# Patient Record
Sex: Female | Born: 1990 | Race: White | Hispanic: No | Marital: Single | Smoking: Never smoker
Health system: Southern US, Community
[De-identification: ages and names within clinical notes are randomized; demographics above are authoritative.]

## PROBLEM LIST (undated history)

## (undated) ENCOUNTER — Inpatient Hospital Stay: Payer: Self-pay

## (undated) DIAGNOSIS — H9192 Unspecified hearing loss, left ear: Secondary | ICD-10-CM

## (undated) DIAGNOSIS — E669 Obesity, unspecified: Secondary | ICD-10-CM

## (undated) HISTORY — DX: Obesity, unspecified: E66.9

## (undated) HISTORY — PX: NO PAST SURGERIES: SHX2092

---
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
## 2012-02-01 ENCOUNTER — Emergency Department: Payer: Self-pay | Admitting: *Deleted

## 2012-02-11 ENCOUNTER — Emergency Department: Payer: Self-pay | Admitting: Emergency Medicine

## 2012-04-11 ENCOUNTER — Emergency Department: Payer: Self-pay | Admitting: Emergency Medicine

## 2012-06-15 ENCOUNTER — Emergency Department: Payer: Self-pay | Admitting: Emergency Medicine

## 2012-12-26 ENCOUNTER — Emergency Department: Payer: Self-pay | Admitting: Emergency Medicine

## 2013-01-12 ENCOUNTER — Emergency Department: Payer: Self-pay | Admitting: Internal Medicine

## 2013-02-03 ENCOUNTER — Emergency Department: Payer: Self-pay | Admitting: Emergency Medicine

## 2013-02-03 LAB — CBC
HGB: 12.6 g/dL (ref 12.0–16.0)
MCH: 29.4 pg (ref 26.0–34.0)
Platelet: 260 10*3/uL (ref 150–440)
WBC: 12.8 10*3/uL — ABNORMAL HIGH (ref 3.6–11.0)

## 2013-02-03 LAB — COMPREHENSIVE METABOLIC PANEL
Alkaline Phosphatase: 100 U/L (ref 82–169)
Anion Gap: 5 — ABNORMAL LOW (ref 7–16)
BUN: 10 mg/dL (ref 7–18)
Chloride: 107 mmol/L (ref 98–107)
Co2: 27 mmol/L (ref 21–32)
Creatinine: 0.81 mg/dL (ref 0.60–1.30)
EGFR (African American): >60
Glucose: 112 mg/dL — ABNORMAL HIGH (ref 65–99)
SGPT (ALT): 16 U/L (ref 12–78)
Sodium: 139 mmol/L (ref 136–145)
Total Protein: 7.7 g/dL (ref 6.4–8.6)

## 2013-02-03 LAB — URINALYSIS, COMPLETE
Blood: NEGATIVE
Glucose,UR: NEGATIVE mg/dL (ref 0–75)
Ketone: NEGATIVE
Ph: 5 (ref 4.5–8.0)
Protein: NEGATIVE
RBC,UR: 3 /HPF (ref 0–5)
WBC UR: 4 /HPF (ref 0–5)

## 2013-02-03 LAB — PREGNANCY, URINE: Pregnancy Test, Urine: NEGATIVE m[IU]/mL

## 2013-02-04 ENCOUNTER — Emergency Department: Payer: Self-pay | Admitting: Emergency Medicine

## 2013-02-04 LAB — CBC
HCT: 39.8 % (ref 35.0–47.0)
HGB: 13.7 g/dL (ref 12.0–16.0)
MCH: 31.1 pg (ref 26.0–34.0)
MCHC: 34.5 g/dL (ref 32.0–36.0)
MCV: 90 fL (ref 80–100)
RBC: 4.42 10*6/uL (ref 3.80–5.20)
RDW: 12.5 % (ref 11.5–14.5)
WBC: 6.7 10*3/uL (ref 3.6–11.0)

## 2013-11-02 ENCOUNTER — Emergency Department: Payer: Self-pay | Admitting: Emergency Medicine

## 2014-01-13 ENCOUNTER — Emergency Department: Payer: Self-pay | Admitting: Emergency Medicine

## 2014-01-13 LAB — CBC
HCT: 42.8 % (ref 35.0–47.0)
HGB: 14.3 g/dL (ref 12.0–16.0)
MCH: 31 pg (ref 26.0–34.0)
MCHC: 33.4 g/dL (ref 32.0–36.0)
MCV: 93 fL (ref 80–100)
Platelet: 232 10*3/uL (ref 150–440)
RBC: 4.62 10*6/uL (ref 3.80–5.20)
RDW: 12.7 % (ref 11.5–14.5)
WBC: 8.7 10*3/uL (ref 3.6–11.0)

## 2014-01-13 LAB — COMPREHENSIVE METABOLIC PANEL
ALK PHOS: 90 U/L
Albumin: 3.9 g/dL (ref 3.4–5.0)
Anion Gap: 8 (ref 7–16)
BILIRUBIN TOTAL: 0.3 mg/dL (ref 0.2–1.0)
BUN: 13 mg/dL (ref 7–18)
CALCIUM: 9.2 mg/dL (ref 8.5–10.1)
CO2: 23 mmol/L (ref 21–32)
Chloride: 108 mmol/L — ABNORMAL HIGH (ref 98–107)
Creatinine: 0.89 mg/dL (ref 0.60–1.30)
EGFR (Non-African Amer.): >60
Glucose: 94 mg/dL (ref 65–99)
OSMOLALITY: 277 (ref 275–301)
Potassium: 3.8 mmol/L (ref 3.5–5.1)
SGOT(AST): 32 U/L (ref 15–37)
SGPT (ALT): 21 U/L (ref 12–78)
SODIUM: 139 mmol/L (ref 136–145)
Total Protein: 8 g/dL (ref 6.4–8.2)

## 2014-01-13 LAB — URINALYSIS, COMPLETE
BILIRUBIN, UR: NEGATIVE
Bacteria: NONE SEEN
Blood: NEGATIVE
Glucose,UR: NEGATIVE mg/dL (ref 0–75)
LEUKOCYTE ESTERASE: NEGATIVE
NITRITE: NEGATIVE
Ph: 6 (ref 4.5–8.0)
Protein: 25
RBC,UR: 7 /HPF (ref 0–5)
SPECIFIC GRAVITY: 1.02 (ref 1.003–1.030)
Squamous Epithelial: NONE SEEN
WBC UR: 108 /HPF (ref 0–5)

## 2014-01-13 LAB — LIPASE, BLOOD: LIPASE: 91 U/L (ref 73–393)

## 2014-01-14 LAB — URINE CULTURE

## 2014-08-28 ENCOUNTER — Emergency Department: Payer: Self-pay | Admitting: Emergency Medicine

## 2015-05-14 ENCOUNTER — Emergency Department
Admission: EM | Admit: 2015-05-14 | Discharge: 2015-05-14 | Disposition: A | Discharge status: Home / Self Care | Payer: Self-pay | Attending: Emergency Medicine | Admitting: Emergency Medicine

## 2015-05-14 ENCOUNTER — Encounter: Payer: Self-pay | Admitting: *Deleted

## 2015-05-14 ENCOUNTER — Emergency Department: Payer: Self-pay

## 2015-05-14 DIAGNOSIS — Y9389 Activity, other specified: Secondary | ICD-10-CM | POA: Insufficient documentation

## 2015-05-14 DIAGNOSIS — X58XXXA Exposure to other specified factors, initial encounter: Secondary | ICD-10-CM | POA: Insufficient documentation

## 2015-05-14 DIAGNOSIS — Y9289 Other specified places as the place of occurrence of the external cause: Secondary | ICD-10-CM | POA: Insufficient documentation

## 2015-05-14 DIAGNOSIS — Y998 Other external cause status: Secondary | ICD-10-CM | POA: Insufficient documentation

## 2015-05-14 DIAGNOSIS — R0789 Other chest pain: Secondary | ICD-10-CM

## 2015-05-14 DIAGNOSIS — Z3202 Encounter for pregnancy test, result negative: Secondary | ICD-10-CM | POA: Insufficient documentation

## 2015-05-14 DIAGNOSIS — S29011A Strain of muscle and tendon of front wall of thorax, initial encounter: Secondary | ICD-10-CM | POA: Insufficient documentation

## 2015-05-14 LAB — BASIC METABOLIC PANEL
Anion gap: 9 (ref 5–15)
BUN: 13 mg/dL (ref 6–20)
CALCIUM: 9.2 mg/dL (ref 8.9–10.3)
CO2: 23 mmol/L (ref 22–32)
CREATININE: 0.74 mg/dL (ref 0.44–1.00)
Chloride: 108 mmol/L (ref 101–111)
GFR calc Af Amer: >60 mL/min (ref >60)
GLUCOSE: 98 mg/dL (ref 65–99)
POTASSIUM: 3.5 mmol/L (ref 3.5–5.1)
SODIUM: 140 mmol/L (ref 135–145)

## 2015-05-14 LAB — CBC
HEMATOCRIT: 39.8 % (ref 35.0–47.0)
Hemoglobin: 13.7 g/dL (ref 12.0–16.0)
MCH: 30.9 pg (ref 26.0–34.0)
MCHC: 34.4 g/dL (ref 32.0–36.0)
MCV: 89.8 fL (ref 80.0–100.0)
PLATELETS: 245 10*3/uL (ref 150–440)
RBC: 4.43 MIL/uL (ref 3.80–5.20)
RDW: 12.2 % (ref 11.5–14.5)
WBC: 10.2 10*3/uL (ref 3.6–11.0)

## 2015-05-14 LAB — POCT PREGNANCY, URINE: PREG TEST UR: NEGATIVE

## 2015-05-14 LAB — TROPONIN I

## 2015-05-14 MED ORDER — ACETAMINOPHEN 500 MG PO TABS
1000.0000 mg | ORAL_TABLET | Freq: Once | ORAL | Status: AC
  Administered 2015-05-14: 1000 mg via ORAL
  Filled 2015-05-14: qty 2

## 2015-05-14 NOTE — ED Notes (Signed)
Pt dc home ambulatory pain unchanged instructed on follow up plan no rx given. PT NAD AT DC

## 2015-05-14 NOTE — ED Provider Notes (Signed)
[facility] [place] Emergency Department Provider Note  ____________________________________________  Time seen: 42  I have reviewed the triage vital signs and the nursing notes.   HISTORY  Chief Complaint Chest Pain     HPI Alexis Horne is a 24 y.o. female with left sided chest pain for the past 2 days. Pain is worse with movement of the left arm.  It is also notably worse when she first wakes up in the morning and she feels stiff and sore. She reports she has discomfort in bed as well.  The patient has recently moving. She reports having to move a heavy vacuum cleaner and holding up and away from her body with her left arm.  She denies any shortness of breath, nausea, or vomiting. She has not had similar symptoms before.   History reviewed. No pertinent past medical history.  There are no active problems to display for this patient.   History reviewed. No pertinent past surgical history.  No current outpatient prescriptions on file.  Allergies Ibuprofen  No family history on file.  Social History Social History  Substance Use Topics  . Smoking status: Never Smoker   . Smokeless tobacco: None  . Alcohol Use: No    Review of Systems  Constitutional: Negative for fever. ENT: Negative for sore throat. Cardiovascular: Positive for chest pain. Respiratory: Negative for cough.Negative for abdominal pain, vomiting and diarrhea Gastrointestinal: Negative for abdominal pain, vomiting and diarrhea. Genitourinary: Negative for dysuria. Musculoskeletal: Pain, left chest, with movement of left arm. See history of present illness Skin: Negative for rash. Neurological: Denies headache or focal weakness. 10-point ROS otherwise negative.  ____________________________________________   PHYSICAL EXAM:  VITAL SIGNS: ED Triage Vitals  Enc Vitals Group     BP 05/14/15 2027 128/82 mmHg     Pulse Rate 05/14/15 2027 94     Resp 05/14/15 2027 18   Temp 05/14/15 2027 98.1 F (36.7 C)     Temp Source 05/14/15 2027 Oral     SpO2 05/14/15 2027 100 %     Weight 05/14/15 2027 230 lb (104.327 kg)     Height 05/14/15 2027  (1.651 m)     Head Cir --      Peak Flow --      Pain Score 05/14/15 2040 6     Pain Loc --      Pain Edu? --      Excl. in GC? --     Constitutional:  Alert and oriented. Appears a little uncomfortable but in no distress. ENT   Head: Normocephalic and atraumatic.   Nose: No congestion/rhinnorhea.    Cardiovascular: Normal rate, regular rhythm, no murmur noted Chest wall: Notable exquisite tenderness in the left pectoralis from the shoulder to the sternum. No tenderness on the right. Respiratory:  Normal respiratory effort, no tachypnea.    Breath sounds are clear and equal bilaterally.  Gastrointestinal: Soft and nontender. No distention.  Back: No muscle spasm, no tenderness, no CVA tenderness. Musculoskeletal: No deformity noted. Nontender with normal range of motion in all extremities.  No noted edema. Neurologic:  Normal speech and language. No gross focal neurologic deficits are appreciated.  Skin:  Skin is warm, dry. No rash noted. Psychiatric: Mood and affect are normal. Speech and behavior are normal.  ____________________________________________    LABS (pertinent positives/negatives)  Labs Reviewed  BASIC METABOLIC PANEL  CBC  TROPONIN I  POC URINE PREG, ED  POCT PREGNANCY, URINE     ____________________________________________  EKG  ED ECG REPORT I, Rafia Shedden W, the attending physician, personally viewed and interpreted this ECG.   Date: 05/14/2015  EKG Time: 2026  Rate: 90  Rhythm: Normal sinus rhythm  Axis: Normal  Intervals: Normal  ST&T Change: Downward T in V3, otherwise normal ST/T   ____________________________________________    RADIOLOGY  FINDINGS: The heart size and mediastinal contours are within normal limits. Both lungs are clear. The  visualized skeletal structures are unremarkable.  IMPRESSION: No active cardiopulmonary disease  ____________________________________________  INITIAL IMPRESSION / ASSESSMENT AND PLAN / ED COURSE  Pertinent labs & imaging results that were available during my care of the patient were reviewed by me and considered in my medical decision making (see chart for details).  Pleasant 24 year old female with obvious musculoskeletal pain on examination. This is consistent with her history of recent moving, lifting numerous heavy items, including a heavy vacuum that she mentions specifically.  Blood tests and chest x-ray are all normal. Her EKG looks good. Of note, the blood tests were ordered after triage by protocol, however at her age and with his history I do not feel they were indicated.  The patient is allergic to ibuprofen, otherwise we would start her on a nonsteroidal anti-inflammatory. I've advised her to take Tylenol and use a heating pad if needed to loosen the anterior chest wall and gain more comfort.  ____________________________________________   FINAL CLINICAL IMPRESSION(S) / ED DIAGNOSES  Final diagnoses:  Chest wall pain  Pectoralis muscle strain, initial encounter      Darien Ramusavid W Emmersyn Kratzke, MD 05/14/15 2214

## 2015-05-14 NOTE — ED Notes (Signed)
Pt reports two days of central chest pain and cough.

## 2015-05-14 NOTE — Discharge Instructions (Signed)
You have a strain of your left pectoralis muscle. Ordinarily, ibuprofen would be used, but since you are allergic to this, you are Limited to Tylenol and other conservative measures. You may find a heating pad helps ease some of the soreness. Continue light activity. Light activity and gentle stretching may help you recover more quickly. If you have shortness of breath, worsening chest pain, or other urgent concerns, return to the emergency department.  Chest Wall Pain Chest wall pain is pain in or around the bones and muscles of your chest. Sometimes, an injury causes this pain. Sometimes, the cause may not be known. This pain may take several weeks or longer to get better. HOME CARE Pay attention to any changes in your symptoms. Take these actions to help with your pain:  Rest as told by your doctor.  Avoid activities that cause pain. Try not to use your chest, belly (abdominal), or side muscles to lift heavy things.  If directed, apply ice to the painful area:  Put ice in a plastic bag.  Place a towel between your skin and the bag.  Leave the ice on for 20 minutes, 2-3 times per day.  Take over-the-counter and prescription medicines only as told by your doctor.  Do not use tobacco products, including cigarettes, chewing tobacco, and e-cigarettes. If you need help quitting, ask your doctor.  Keep all follow-up visits as told by your doctor. This is important. GET HELP IF:  You have a fever.  Your chest pain gets worse.  You have new symptoms. GET HELP RIGHT AWAY IF:  You feel sick to your stomach (nauseous) or you throw up (vomit).  You feel sweaty or light-headed.  You have a cough with phlegm (sputum) or you cough up blood.  You are short of breath.   This information is not intended to replace advice given to you by your health care provider. Make sure you discuss any questions you have with your health care provider.   Document Released: 12/24/2007 Document Revised:  03/28/2015 Document Reviewed: 10/02/2014 Elsevier Interactive Patient Education [employer]2016 Else[employer] Inc.

## 2016-01-09 ENCOUNTER — Emergency Department: Payer: Self-pay

## 2016-01-09 ENCOUNTER — Other Ambulatory Visit: Payer: Self-pay

## 2016-01-09 ENCOUNTER — Emergency Department
Admission: EM | Admit: 2016-01-09 | Discharge: 2016-01-09 | Discharge status: Against Medical Advice | Payer: Self-pay | Attending: Emergency Medicine | Admitting: Emergency Medicine

## 2016-01-09 ENCOUNTER — Encounter: Payer: Self-pay | Admitting: Emergency Medicine

## 2016-01-09 DIAGNOSIS — R079 Chest pain, unspecified: Secondary | ICD-10-CM | POA: Insufficient documentation

## 2016-01-09 LAB — CBC
HEMATOCRIT: 39.7 % (ref 35.0–47.0)
HEMOGLOBIN: 13.7 g/dL (ref 12.0–16.0)
MCH: 31 pg (ref 26.0–34.0)
MCHC: 34.6 g/dL (ref 32.0–36.0)
MCV: 89.5 fL (ref 80.0–100.0)
Platelets: 189 10*3/uL (ref 150–440)
RBC: 4.43 MIL/uL (ref 3.80–5.20)
RDW: 12.4 % (ref 11.5–14.5)
WBC: 6.2 10*3/uL (ref 3.6–11.0)

## 2016-01-09 LAB — BASIC METABOLIC PANEL
ANION GAP: 7 (ref 5–15)
BUN: 11 mg/dL (ref 6–20)
CALCIUM: 9.1 mg/dL (ref 8.9–10.3)
CO2: 25 mmol/L (ref 22–32)
Chloride: 108 mmol/L (ref 101–111)
Creatinine, Ser: 0.72 mg/dL (ref 0.44–1.00)
GFR calc Af Amer: >60 mL/min (ref >60)
GFR calc non Af Amer: >60 mL/min (ref >60)
GLUCOSE: 98 mg/dL (ref 65–99)
POTASSIUM: 3.4 mmol/L — AB (ref 3.5–5.1)
Sodium: 140 mmol/L (ref 135–145)

## 2016-01-09 LAB — URINALYSIS COMPLETE WITH MICROSCOPIC (ARMC ONLY)
BILIRUBIN URINE: NEGATIVE
Glucose, UA: NEGATIVE mg/dL
Hgb urine dipstick: NEGATIVE
KETONES UR: NEGATIVE mg/dL
LEUKOCYTES UA: NEGATIVE
Nitrite: NEGATIVE
PH: 5 (ref 5.0–8.0)
Protein, ur: NEGATIVE mg/dL
SPECIFIC GRAVITY, URINE: 1.025 (ref 1.005–1.030)

## 2016-01-09 LAB — FIBRIN DERIVATIVES D-DIMER (ARMC ONLY): Fibrin derivatives D-dimer (ARMC): 292 (ref 0–499)

## 2016-01-09 LAB — TROPONIN I: Troponin I: <0.03 ng/mL (ref <0.031)

## 2016-01-09 NOTE — ED Notes (Signed)
Patient ambulatory to triage with steady gait, without difficulty or distress noted; pt reports left sided CP, nonradiating since am with some SHOB; denies hx of same

## 2016-01-09 NOTE — ED Notes (Signed)
Pt attempted to urinate and was unsuccessful at doing so. Pt was told to keep specimen cup for when is able to urinate.

## 2016-01-09 NOTE — ED Provider Notes (Addendum)
[facility] Emergency Department Provider Note  ____________________________________________  Time seen: 2: 30 a.m.  I have reviewed the triage vital signs and the nursing notes.   HISTORY  Chief Complaint Chest Pain      HPI Alexis Horne is a 25 y.o. female presents with a 6 out of 10 nonradiating left-sided upper chest pain times one day. Patient denies any aggravating or alleviating factors. Patient denies any cough or shortness of breath at this time. Patient denies any lower respiratory pain or swelling.     Past medical history None There are no active problems to display for this patient.   Past surgical history None No current outpatient prescriptions on file.  Allergies Ibuprofen  No family history on file.  Social History Social History  Substance Use Topics  . Smoking status: Never Smoker   . Smokeless tobacco: None  . Alcohol Use: No    Review of Systems  Constitutional: Negative for fever. Eyes: Negative for visual changes. ENT: Negative for sore throat. Cardiovascular: Positive for chest pain. Respiratory: Positive for shortness of breath. Gastrointestinal: Negative for abdominal pain, vomiting and diarrhea. Genitourinary: Negative for dysuria. Musculoskeletal: Negative for back pain. Skin: Negative for rash. Neurological: Negative for headaches, focal weakness or numbness.   10-point ROS otherwise negative.  ____________________________________________   PHYSICAL EXAM:  VITAL SIGNS: ED Triage Vitals  Enc Vitals Group     BP 01/09/16 0059 135/84 mmHg     Pulse Rate 01/09/16 0059 84     Resp 01/09/16 0059 18     Temp 01/09/16 0059 97.9 F (36.6 C)     Temp Source 01/09/16 0059 Oral     SpO2 01/09/16 0059 97 %     Weight 01/09/16 0059 210 lb (95.255 kg)     Height 01/09/16 0059 5\' 5"  (1.651 m)     Head Cir --      Peak Flow --      Pain Score 01/09/16 0056 7     Pain Loc --      Pain Edu? --      Excl. in GC? --      Constitutional: Alert and oriented. Well appearing and in no distress. Eyes: Conjunctivae are normal. PERRL. Normal extraocular movements. ENT   Head: Normocephalic and atraumatic.   Nose: No congestion/rhinnorhea.   Mouth/Throat: Mucous membranes are moist.   Neck: No stridor. Hematological/Lymphatic/Immunilogical: No cervical lymphadenopathy. Cardiovascular: Normal rate, regular rhythm. Normal and symmetric distal pulses are present in all extremities. No murmurs, rubs, or gallops. Respiratory: Normal respiratory effort without tachypnea nor retractions. Breath sounds are clear and equal bilaterally. No wheezes/rales/rhonchi. Gastrointestinal: Soft and nontender. No distention. There is no CVA tenderness. Genitourinary: deferred Musculoskeletal: Nontender with normal range of motion in all extremities. No joint effusions.  No lower extremity tenderness nor edema. Neurologic:  Normal speech and language. No gross focal neurologic deficits are appreciated. Speech is normal.  Skin:  Skin is warm, dry and intact. No rash noted. Psychiatric: Mood and affect are normal. Speech and behavior are normal. Patient exhibits appropriate insight and judgment.  ____________________________________________    LABS (pertinent positives/negatives)  Labs Reviewed  BASIC METABOLIC PANEL - Abnormal; Notable for the following:    Potassium 3.4 (*)    All other components within normal limits  URINALYSIS COMPLETEWITH MICROSCOPIC ([facility] ONLY) - Abnormal; Notable for the following:    Color, Urine YELLOW (*)    APPearance HAZY (*)    Bacteria, UA RARE (*)  Squamous Epithelial / LPF 0-5 (*)    All other components within normal limits  CBC  TROPONIN I  FIBRIN DERIVATIVES D-DIMER ([facility] ONLY)     ____________________________________________   EKG  ED ECG REPORT I, [place] N BROWN, the attending physician, personally viewed and interpreted this ECG.    Date: 01/09/2016  EKG Time: 1:05 AM  Rate: 84  Rhythm: Normal sinus rhythm  Axis: Normal  Intervals: Normal  ST&T Change: None   ____________________________________________    RADIOLOGY DG Chest 2 View (Final result) Result time: 01/09/16 01:40:49   Final result by Rad Results In Interface (01/09/16 01:40:49)   Narrative:   CLINICAL DATA: Acute onset of left-sided chest pain radiating to the left shoulder. Difficulty breathing. Initial encounter.  EXAM: CHEST 2 VIEW  COMPARISON: Chest radiograph performed 05/14/2015  FINDINGS: The lungs are well-aerated and clear. There is no evidence of focal opacification, pleural effusion or pneumothorax.  The heart is normal in size; the mediastinal contour is within normal limits. No acute osseous abnormalities are seen.  IMPRESSION: No acute cardiopulmonary process seen.   Electronically Signed By: Roanna Raider M.D. On: 01/09/2016 01:40         INITIAL IMPRESSION / ASSESSMENT AND PLAN / ED COURSE  Pertinent labs & imaging results that were available during my care of the patient were reviewed by me and considered in my medical decision making (see chart for details).  Patient left the emergency department AGAINST MEDICAL ADVICE before d-dimer resulted  No clear etiology for the patient's chest pain identified at this time. Cardiac enzymes negative d-dimer negative EKG revealed no evidence of ischemia or infarction. Patient referred to primary care provider for outpatient evaluation   ____________________________________________   FINAL CLINICAL IMPRESSION(S) / ED DIAGNOSES  Final diagnoses:  Chest pain, unspecified chest pain type      Darci Current, MD 01/09/16 [id number]  Darci Current, MD 01/09/16 [id number]

## 2016-01-09 NOTE — Discharge Instructions (Signed)
Nonspecific Chest Pain  °Chest pain can be caused by many different conditions. There is always a chance that your pain could be related to something serious, such as a heart attack or a blood clot in your lungs. Chest pain can also be caused by conditions that are not life-threatening. If you have chest pain, it is very important to follow up with your health care provider. °CAUSES  °Chest pain can be caused by: °· Heartburn. °· Pneumonia or bronchitis. °· Anxiety or stress. °· Inflammation around your heart (pericarditis) or lung (pleuritis or pleurisy). °· A blood clot in your lung. °· A collapsed lung (pneumothorax). It can develop suddenly on its own (spontaneous pneumothorax) or from trauma to the chest. °· Shingles infection (varicella-zoster virus). °· Heart attack. °· Damage to the bones, muscles, and cartilage that make up your chest wall. This can include: °¨ Bruised bones due to injury. °¨ Strained muscles or cartilage due to frequent or repeated coughing or overwork. °¨ Fracture to one or more ribs. °¨ Sore cartilage due to inflammation (costochondritis). °RISK FACTORS  °Risk factors for chest pain may include: °· Activities that increase your risk for trauma or injury to your chest. °· Respiratory infections or conditions that cause frequent coughing. °· Medical conditions or overeating that can cause heartburn. °· Heart disease or family history of heart disease. °· Conditions or health behaviors that increase your risk of developing a blood clot. °· Having had chicken pox (varicella zoster). °SIGNS AND SYMPTOMS °Chest pain can feel like: °· Burning or tingling on the surface of your chest or deep in your chest. °· Crushing, pressure, aching, or squeezing pain. °· Dull or sharp pain that is worse when you move, cough, or take a deep breath. °· Pain that is also felt in your back, neck, shoulder, or arm, or pain that spreads to any of these areas. °Your chest pain may come and go, or it may stay  constant. °DIAGNOSIS °Lab tests or other studies may be needed to find the cause of your pain. Your health care provider may have you take a test called an ambulatory ECG (electrocardiogram). An ECG records your heartbeat patterns at the time the test is performed. You may also have other tests, such as: °· Transthoracic echocardiogram (TTE). During echocardiography, sound waves are used to create a picture of all of the heart structures and to look at how blood flows through your heart. °· Transesophageal echocardiogram (TEE). This is a more advanced imaging test that obtains images from inside your body. It allows your health care provider to see your heart in finer detail. °· Cardiac monitoring. This allows your health care provider to monitor your heart rate and rhythm in real time. °· Holter monitor. This is a portable device that records your heartbeat and can help to diagnose abnormal heartbeats. It allows your health care provider to track your heart activity for several days, if needed. °· Stress tests. These can be done through exercise or by taking medicine that makes your heart beat more quickly. °· Blood tests. °· Imaging tests. °TREATMENT  °Your treatment depends on what is causing your chest pain. Treatment may include: °· Medicines. These may include: °¨ Acid blockers for heartburn. °¨ Anti-inflammatory medicine. °¨ Pain medicine for inflammatory conditions. °¨ Antibiotic medicine, if an infection is present. °¨ Medicines to dissolve blood clots. °¨ Medicines to treat coronary artery disease. °· Supportive care for conditions that do not require medicines. This may include: °¨ Resting. °¨ Applying heat   or cold packs to injured areas. °¨ Limiting activities until pain decreases. °HOME CARE INSTRUCTIONS °· If you were prescribed an antibiotic medicine, finish it all even if you start to feel better. °· Avoid any activities that bring on chest pain. °· Do not use any tobacco products, including  cigarettes, chewing tobacco, or electronic cigarettes. If you need help quitting, ask your health care provider. °· Do not drink alcohol. °· Take medicines only as directed by your health care provider. °· Keep all follow-up visits as directed by your health care provider. This is important. This includes any further testing if your chest pain does not go away. °· If heartburn is the cause for your chest pain, you may be told to keep your head raised (elevated) while sleeping. This reduces the chance that acid will go from your stomach into your esophagus. °· Make lifestyle changes as directed by your health care provider. These may include: °¨ Getting regular exercise. Ask your health care provider to suggest some activities that are safe for you. °¨ Eating a heart-healthy diet. A registered dietitian can help you to learn healthy eating options. °¨ Maintaining a healthy weight. °¨ Managing diabetes, if necessary. °¨ Reducing stress. °SEEK MEDICAL CARE IF: °· Your chest pain does not go away after treatment. °· You have a rash with blisters on your chest. °· You have a fever. °SEEK IMMEDIATE MEDICAL CARE IF:  °· Your chest pain is worse. °· You have an increasing cough, or you cough up blood. °· You have severe abdominal pain. °· You have severe weakness. °· You faint. °· You have chills. °· You have sudden, unexplained chest discomfort. °· You have sudden, unexplained discomfort in your arms, back, neck, or jaw. °· You have shortness of breath at any time. °· You suddenly start to sweat, or your skin gets clammy. °· You feel nauseous or you vomit. °· You suddenly feel light-headed or dizzy. °· Your heart begins to beat quickly, or it feels like it is skipping beats. °These symptoms may represent a serious problem that is an emergency. Do not wait to see if the symptoms will go away. Get medical help right away. Call your local emergency services (911 in the [place] Do not drive yourself to the hospital. °  °This  information is not intended to replace advice given to you by your health care provider. Make sure you discuss any questions you have with your health care provider. °  °Document Released: 04/16/2005 Document Revised: 07/28/2014 Document Reviewed: 02/10/2014 °Elsevier Interactive Patient Education ©2016 Elsevier Inc. ° °

## 2016-01-15 ENCOUNTER — Encounter: Payer: Self-pay | Admitting: Emergency Medicine

## 2016-01-15 ENCOUNTER — Emergency Department
Admission: EM | Admit: 2016-01-15 | Discharge: 2016-01-15 | Disposition: A | Discharge status: Home / Self Care | Payer: Self-pay | Attending: Emergency Medicine | Admitting: Emergency Medicine

## 2016-01-15 DIAGNOSIS — Z0000 Encounter for general adult medical examination without abnormal findings: Secondary | ICD-10-CM | POA: Insufficient documentation

## 2016-01-15 NOTE — ED Notes (Signed)
States she was seen couple of days ago with cough  States she needs a note to go back to work..Marland Kitchen

## 2016-01-15 NOTE — Discharge Instructions (Signed)
°  Provisional Diagnosis You have been seen today, and your health care provider suspects that you have the following condition:__Release for work._ Before a final diagnosis is made, your test results may need to be considered. There is also a chance that your tests will need to be repeated. A check box has been marked next to each of the following items that applies to you: _____X-rays were taken today and will be read by a radiologist. The official report will be available to your health care provider in 1-2 days. If there is a difference between the initial results of your X-ray and the radiologist's report, we will call you. _____Lab work was done today and should be processed within 24 hours. Contact your health care provider in 1 day for your results. _____Cultures were done today. It generally takes at least 2 days for results to be available. Contact your health care provider in 2-3 days for your results. _____You had one or more procedures done today, and the results will not be available for __________ days. Contact your health care provider in __________ days for your results. If the hospital or lab is going to call you, make sure to give them a phone number where you can be called and a time when you will be available at that number. If you are going to call the hospital or lab, find out what number to call and the recommended time for you to call.   This information is not intended to replace advice given to you by your health care provider. Make sure you discuss any questions you have with your health care provider.   Document Released: 07/07/2005 Document Revised: 07/28/2014 Document Reviewed: 02/03/2014 Elsevier Interactive Patient Education [employer]

## 2016-01-15 NOTE — ED Provider Notes (Signed)
[facility] Emergency Department Provider Note   ____________________________________________  Time seen: Approximately 4:49 PM  I have reviewed the triage vital signs and the nursing notes.   HISTORY  Chief Complaint No chief complaint on file.    HPI Alexis Horne is a 25 y.o. female who arrives to the emergency department with complaint of medical clearance. Patient states that she felt she had the flu and told her employer this. When she arrived to work today her employer demanded that she have a note stating the she did not have the flu and could return to work. She denies fever, chills, myalgias, cough, shortness of breath, and URI symptoms.   History reviewed. No pertinent past medical history.  There are no active problems to display for this patient.   History reviewed. No pertinent past surgical history.  No current outpatient prescriptions on file.  Allergies Ibuprofen  No family history on file.  Social History Social History  Substance Use Topics  . Smoking status: Never Smoker   . Smokeless tobacco: None  . Alcohol Use: No    Review of Systems Constitutional: No fever/chills, night sweats, fatigue Eyes: No visual changes, ocular discharge, erythema ENT: No sore throat, odynophagia, rhinorrhea, coryza, or dysphagia Cardiovascular: Denies chest pain or palpitations Respiratory: Denies shortness of breath, dyspnea, and cough Gastrointestinal: No abdominal pain.  No nausea, no vomiting.  No diarrhea.   Musculoskeletal: Negative for myalgias Skin: Negative for rash. Neurological: Negative for headaches, focal weakness or numbness.  10-point ROS otherwise negative.  ____________________________________________   PHYSICAL EXAM:  VITAL SIGNS: ED Triage Vitals  Enc Vitals Group     BP 01/15/16 1633 129/89 mmHg     Pulse Rate 01/15/16 1633 91     Resp 01/15/16 1633 20     Temp 01/15/16 1633 98.1 F (36.7 C)   Temp Source 01/15/16 1633 Oral     SpO2 01/15/16 1633 97 %     Weight 01/15/16 1633 210 lb (95.255 kg)     Height 01/15/16 1633 5\' 5"  (1.651 m)     Head Cir --      Peak Flow --      Pain Score --      Pain Loc --      Pain Edu? --      Excl. in GC? --     Constitutional: Alert and oriented. Well appearing and in no acute distress. Eyes: Conjunctivae are normal. Without edema, erythema or discharge Head: Atraumatic, normocephalic Nose: No congestion/rhinnorhea. Mouth/Throat: Mucous membranes are moist.  Oropharynx non-erythematous, without edema or exudate Neck: No stridor.   Hematological/Lymphatic/Immunilogical: No cervical or supraclavicular lymphadenopathy. Cardiovascular: Normal rate, regular rhythm. Grossly normal heart sounds.  Good peripheral circulation. Respiratory: Normal respiratory effort.  No retractions. Lungs CTAB. No wheezing, rales or rhonchi Gastrointestinal: Soft and nontender. No distention.  Neurologic:  Normal speech and language. No gross focal neurologic deficits are appreciated. No gait instability. Skin:  Skin is warm, dry and intact. No rash noted. Psychiatric: Mood and affect are normal. Speech and behavior are normal.  ____________________________________________   LABS (all labs ordered are listed, but only abnormal results are displayed)  Labs Reviewed - No data to display ____________________________________________  EKG  None ____________________________________________  RADIOLOGY  None ____________________________________________   PROCEDURES  Procedure(s) performed: None  Critical Care performed: No  ____________________________________________   INITIAL IMPRESSION / ASSESSMENT AND PLAN / ED COURSE  Pertinent labs & imaging results that were available during my  care of the patient were reviewed by me and considered in my medical decision making (see chart for details).  Well adult exam: Based on clinical findings and  symptomology patient appears to be well without influenza. Patient discharged with note to return to work. On 6/21 patient left AMA before D dimer results arrived. Results were WNL.    ____________________________________________   FINAL CLINICAL IMPRESSION(S) / ED DIAGNOSES  Final diagnoses:  Well adult exam      NEW MEDICATIONS STARTED DURING THIS VISIT:  There are no discharge medications for this patient.    Note:  This document was prepared using Dragon voice recognition software and may include unintentional dictation errors.   Evangeline Dakinharles M Beers, PA-C 02/01/16 1204  Emily FilbertJonathan E Williams, MD 02/01/16 [phone]

## 2016-01-23 NOTE — ED Provider Notes (Signed)
Patient was seen by the physician assistant provider and was given a note to return to work. I was available for consultation.  Jennye MoccasinBrian S Madden Garron, MD 01/23/16 [phone]

## 2016-03-23 ENCOUNTER — Emergency Department: Payer: Self-pay

## 2016-03-23 ENCOUNTER — Emergency Department
Admission: EM | Admit: 2016-03-23 | Discharge: 2016-03-23 | Disposition: A | Discharge status: Home / Self Care | Payer: Self-pay | Attending: Emergency Medicine | Admitting: Emergency Medicine

## 2016-03-23 DIAGNOSIS — N201 Calculus of ureter: Secondary | ICD-10-CM | POA: Insufficient documentation

## 2016-03-23 LAB — URINALYSIS COMPLETE WITH MICROSCOPIC (ARMC ONLY)
Bacteria, UA: NONE SEEN
Bilirubin Urine: NEGATIVE
Glucose, UA: NEGATIVE mg/dL
LEUKOCYTES UA: NEGATIVE
Nitrite: NEGATIVE
PH: 5 (ref 5.0–8.0)
PROTEIN: 30 mg/dL — AB
Specific Gravity, Urine: 1.021 (ref 1.005–1.030)

## 2016-03-23 LAB — COMPREHENSIVE METABOLIC PANEL
ALT: 13 U/L — ABNORMAL LOW (ref 14–54)
ANION GAP: 7 (ref 5–15)
AST: 26 U/L (ref 15–41)
Albumin: 4.4 g/dL (ref 3.5–5.0)
Alkaline Phosphatase: 66 U/L (ref 38–126)
BUN: 11 mg/dL (ref 6–20)
CALCIUM: 9.6 mg/dL (ref 8.9–10.3)
CHLORIDE: 110 mmol/L (ref 101–111)
CO2: 24 mmol/L (ref 22–32)
Creatinine, Ser: 0.8 mg/dL (ref 0.44–1.00)
GFR calc non Af Amer: >60 mL/min (ref >60)
Glucose, Bld: 104 mg/dL — ABNORMAL HIGH (ref 65–99)
Potassium: 3.7 mmol/L (ref 3.5–5.1)
SODIUM: 141 mmol/L (ref 135–145)
Total Bilirubin: 0.4 mg/dL (ref 0.3–1.2)
Total Protein: 8.3 g/dL — ABNORMAL HIGH (ref 6.5–8.1)

## 2016-03-23 LAB — CBC
HCT: 42.5 % (ref 35.0–47.0)
HEMOGLOBIN: 14.7 g/dL (ref 12.0–16.0)
MCH: 30.8 pg (ref 26.0–34.0)
MCHC: 34.6 g/dL (ref 32.0–36.0)
MCV: 89 fL (ref 80.0–100.0)
Platelets: 290 10*3/uL (ref 150–440)
RBC: 4.77 MIL/uL (ref 3.80–5.20)
RDW: 12.4 % (ref 11.5–14.5)
WBC: 15.2 10*3/uL — ABNORMAL HIGH (ref 3.6–11.0)

## 2016-03-23 LAB — LIPASE, BLOOD: LIPASE: 20 U/L (ref 11–51)

## 2016-03-23 LAB — POCT PREGNANCY, URINE: PREG TEST UR: NEGATIVE

## 2016-03-23 MED ORDER — PROMETHAZINE HCL 25 MG/ML IJ SOLN
INTRAMUSCULAR | Status: AC
  Administered 2016-03-23: 25 mg via INTRAVENOUS
  Filled 2016-03-23: qty 1

## 2016-03-23 MED ORDER — OXYCODONE-ACETAMINOPHEN 5-325 MG PO TABS: 1.0000 | ORAL_TABLET | ORAL | 0 refills | Status: DC | PRN

## 2016-03-23 MED ORDER — FENTANYL CITRATE (PF) 100 MCG/2ML IJ SOLN
100.0000 ug | Freq: Once | INTRAMUSCULAR | Status: AC
  Administered 2016-03-23: 100 ug via INTRAVENOUS

## 2016-03-23 MED ORDER — MORPHINE SULFATE (PF) 4 MG/ML IV SOLN
4.0000 mg | Freq: Once | INTRAVENOUS | Status: AC
Start: 2016-03-23 — End: 2016-03-23
  Administered 2016-03-23: 4 mg via INTRAVENOUS

## 2016-03-23 MED ORDER — DOCUSATE SODIUM 100 MG PO CAPS: ORAL_CAPSULE | ORAL | 0 refills | Status: DC

## 2016-03-23 MED ORDER — HYDROMORPHONE HCL 1 MG/ML IJ SOLN: 1.0000 mg | INTRAMUSCULAR | Status: DC

## 2016-03-23 MED ORDER — PROMETHAZINE HCL 25 MG/ML IJ SOLN
25.0000 mg | Freq: Once | INTRAMUSCULAR | Status: AC
  Administered 2016-03-23: 25 mg via INTRAVENOUS

## 2016-03-23 MED ORDER — ONDANSETRON HCL 4 MG/2ML IJ SOLN
4.0000 mg | INTRAMUSCULAR | Status: AC
  Administered 2016-03-23: 4 mg via INTRAVENOUS
  Filled 2016-03-23: qty 2

## 2016-03-23 MED ORDER — MORPHINE SULFATE (PF) 4 MG/ML IV SOLN
INTRAVENOUS | Status: AC
  Administered 2016-03-23: 4 mg via INTRAVENOUS
  Filled 2016-03-23: qty 1

## 2016-03-23 MED ORDER — ONDANSETRON 4 MG PO TBDP: ORAL_TABLET | ORAL | 0 refills | Status: DC

## 2016-03-23 MED ORDER — TAMSULOSIN HCL 0.4 MG PO CAPS: ORAL_CAPSULE | ORAL | 0 refills | Status: DC

## 2016-03-23 MED ORDER — FENTANYL CITRATE (PF) 100 MCG/2ML IJ SOLN
INTRAMUSCULAR | Status: AC
  Administered 2016-03-23: 100 ug via INTRAVENOUS
  Filled 2016-03-23: qty 2

## 2016-03-23 NOTE — ED Triage Notes (Signed)
Pt arrived via EMS from home with c/o left flank pain that started this morning. Pt frequently shifting in chair unable to sit still demanding medications now. Refused to have treatment from EMS.  EMS was able to get a BP of 150/90 p 78 and oxygen 100% on RA.

## 2016-03-23 NOTE — Discharge Instructions (Signed)
You have been seen in the Emergency Department (ED) today for pain that is caused by kidney stones (or more specifically, a 1-mm stone in your left ureter, almost in the bladder).  As we have discussed, please drink plenty of fluids.  Please make a follow up appointment with the physician(s) listed elsewhere in this documentation.  You may take pain medication as needed but ONLY as prescribed.  Please also take your prescribed Flomax daily.  We also recommend that you take over-the-counter ibuprofen regularly according to label instructions over the next 5 days.  Take it with meals to minimize stomach discomfort.  Please see your doctor as soon as possible as stones may take 1-3 weeks to pass and you may require additional care or medications.  Do not drink alcohol, drive or participate in any other potentially dangerous activities while taking opiate pain medication as it may make you sleepy. Do not take this medication with any other sedating medications, either prescription or over-the-counter. If you were prescribed Percocet or Vicodin, do not take these with acetaminophen (Tylenol) as it is already contained within these medications.   Take Percocet as needed for severe pain.  This medication is an opiate (or narcotic) pain medication and can be habit forming.  Use it as little as possible to achieve adequate pain control.  Do not use or use it with extreme caution if you have a history of opiate abuse or dependence.  If you are on a pain contract with your primary care doctor or a pain specialist, be sure to let them know you were prescribed this medication today from the [facility] Emergency Department.  This medication is intended for your use only - do not give any to anyone else and keep it in a secure place where nobody else, especially children, have access to it.  It will also cause or worsen constipation, so you may want to consider taking an over-the-counter stool softener while you are  taking this medication.  Return to the Emergency Department (ED) or call your doctor if you have any worsening pain, fever, painful urination, are unable to urinate, or develop other symptoms that concern you.

## 2016-03-23 NOTE — ED Provider Notes (Signed)
[facility] Emergency Department Provider Note  ____________________________________________   First MD Initiated Contact with Patient 03/23/16 1559     (approximate)  I have reviewed the triage vital signs and the nursing notes.   HISTORY  Chief Complaint Back Pain; Abdominal Pain; and Flank Pain    HPI Alexis Horne is a 25 y.o. female with no significant PMH who presents for evaluation of acute onset severe left flank and left sided abdominal pain.  Severe, sharp stabbing pain, worse with movement and nothing makes it better.  +emesis (multiple episodes).  No diarrhea.  No vaginal symptoms.  Denies dysuria.  Denies fever/chills.  Received fentanyl 100 mcg in triage.    No past medical history on file.  There are no active problems to display for this patient.   No past surgical history on file.  Prior to Admission medications   Medication Sig Start Date End Date Taking? Authorizing Provider  docusate sodium (COLACE) 100 MG capsule Take 1 tablet once or twice daily as needed for constipation while taking narcotic pain medicine 03/23/16   Loleta Rose, MD  ondansetron (ZOFRAN ODT) 4 MG disintegrating tablet Allow 1-2 tablets to dissolve in your mouth every 8 hours as needed for nausea/vomiting 03/23/16   Loleta Rose, MD  oxyCODONE-acetaminophen (ROXICET) 5-325 MG tablet Take 1-2 tablets by mouth every 4 (four) hours as needed for severe pain. 03/23/16   Loleta Rose, MD  tamsulosin (FLOMAX) 0.4 MG CAPS capsule Take 1 tablet by mouth daily until you pass the kidney stone or no longer have symptoms 03/23/16   Loleta Rose, MD    Allergies Ibuprofen  No family history on file.  Social History Social History  Substance Use Topics  . Smoking status: Never Smoker  . Smokeless tobacco: Not on file  . Alcohol use No    Review of Systems Constitutional: No fever/chills Eyes: No visual changes. ENT: No sore throat. Cardiovascular: Denies chest  pain. Respiratory: Denies shortness of breath. Gastrointestinal: + LLQ abdominal and flank pain.  +N/V.  No diarrhea.  No constipation. Genitourinary: Negative for dysuria. Musculoskeletal: Negative for back pain. Skin: Negative for rash. Neurological: Negative for headaches, focal weakness or numbness.  10-point ROS otherwise negative.  ____________________________________________   PHYSICAL EXAM:  VITAL SIGNS: ED Triage Vitals  Enc Vitals Group     BP 03/23/16 1520 (!) 138/120     Pulse Rate 03/23/16 1520 65     Resp 03/23/16 1520 (!) 22     Temp 03/23/16 1520 98.1 F (36.7 C)     Temp Source 03/23/16 1520 Oral     SpO2 03/23/16 1520 100 %     Weight 03/23/16 1521 240 lb (108.9 kg)     Height 03/23/16 1521 5\' 5"  (1.651 m)     Head Circumference --      Peak Flow --      Pain Score 03/23/16 1521 10     Pain Loc --      Pain Edu? --      Excl. in GC? --     Constitutional: Alert and oriented. Well appearing and in no acute distress.  Slurring words and responding slowly after Fentanyl. Eyes: Conjunctivae are normal. PERRL. EOMI. Head: Atraumatic. Nose: No congestion/rhinnorhea. Mouth/Throat: Mucous membranes are moist.  Oropharynx non-erythematous. Neck: No stridor.  No meningeal signs.   Cardiovascular: Normal rate, regular rhythm. Good peripheral circulation. Grossly normal heart sounds. Respiratory: Normal respiratory effort.  No retractions. Lungs CTAB. Gastrointestinal: Obese.  Soft, tender to palpation in LLQ.  +Left CVA tenderness.  No right sided abd tenderness nor CVA tenderness. Musculoskeletal: No lower extremity tenderness nor edema. No gross deformities of extremities. Neurologic:  Normal speech and language. No gross focal neurologic deficits are appreciated.  Skin:  Skin is warm, dry and intact. No rash noted.   ____________________________________________   LABS (all labs ordered are listed, but only abnormal results are displayed)  Labs Reviewed   COMPREHENSIVE METABOLIC PANEL - Abnormal; Notable for the following:       Result Value   Glucose, Bld 104 (*)    Total Protein 8.3 (*)    ALT 13 (*)    All other components within normal limits  CBC - Abnormal; Notable for the following:    WBC 15.2 (*)    All other components within normal limits  URINALYSIS COMPLETEWITH MICROSCOPIC ([facility] ONLY) - Abnormal; Notable for the following:    Color, Urine YELLOW (*)    APPearance HAZY (*)    Ketones, ur TRACE (*)    Hgb urine dipstick 3+ (*)    Protein, ur 30 (*)    Squamous Epithelial / LPF 0-5 (*)    All other components within normal limits  LIPASE, BLOOD  POC URINE PREG, ED  POCT PREGNANCY, URINE   ____________________________________________  EKG  None - EKG not ordered by ED physician ____________________________________________  RADIOLOGY   Ct Renal Stone Study  Result Date: 03/23/2016 CLINICAL DATA:  Severe left flank pain and hematuria beginning this morning. EXAM: CT ABDOMEN AND PELVIS WITHOUT CONTRAST TECHNIQUE: Multidetector CT imaging of the abdomen and pelvis was performed following the standard protocol without IV contrast. COMPARISON:  None. FINDINGS: Lower chest:  No acute findings. Hepatobiliary: No mass visualized on this un-enhanced exam. Mild to moderate diffuse hepatic steatosis. Gallbladder is unremarkable. Pancreas: No mass or inflammatory process identified on this un-enhanced exam. Spleen: Within normal limits in size. Adrenals/Urinary Tract: Normal adrenal glands. Mild left hydroureteronephrosis is seen due to a 1 mm calculus in the distal left ureter near the ureterovesical junction on image 84/2. Stomach/Bowel: No evidence of obstruction, inflammatory process, or abnormal fluid collections. Normal appendix visualized. Vascular/Lymphatic: No pathologically enlarged lymph nodes. No evidence of abdominal aortic aneurysm. Reproductive: No mass or other significant abnormality. Other: None. Musculoskeletal:  No  suspicious bone lesions identified. IMPRESSION: Mild left hydroureteronephrosis due to 1 mm distal left ureteral calculus near the ureterovesical junction. Hepatic steatosis. Electronically Signed   By: Myles RosenthalJohn  Stahl M.D.   On: 03/23/2016 17:37    ____________________________________________   PROCEDURES  Procedure(s) performed:   Procedures   Critical Care performed: No ____________________________________________   INITIAL IMPRESSION / ASSESSMENT AND PLAN / ED COURSE  Pertinent labs & imaging results that were available during my care of the patient were reviewed by me and considered in my medical decision making (see chart for details).  Holding additional pain medication since she is slurring words after Fentayl, do not want to cause respiratory suppression.  Suspect UTI and/or kidney stone.  Less likely adnexal or GI cause.  Urine pending.   Clinical Course  Value Comment By Time  CT Renal Stone Study Small 1 mm stone.  No evidence of acute UTI.  I will update the patient and we will work on pain control. Loleta Roseory Unika Nazareno, MD 09/03 1741   The patient's pain is well-controlled at this time and she is sleeping comfortably.  I updated her boyfriend about everything including outpatient follow-up plan.  No  indication for antibiotics at this time.  I gave my usual customary return precautions. Loleta Rose, MD 09/03 1837    ____________________________________________  FINAL CLINICAL IMPRESSION(S) / ED DIAGNOSES  Final diagnoses:  Ureterolithiasis     MEDICATIONS GIVEN DURING THIS VISIT:  Medications  fentaNYL (SUBLIMAZE) injection 100 mcg (100 mcg Intravenous Given 03/23/16 1531)  ondansetron (ZOFRAN) injection 4 mg (4 mg Intravenous Given 03/23/16 1613)  promethazine (PHENERGAN) injection 25 mg (25 mg Intravenous Given 03/23/16 1709)  morphine 4 MG/ML injection 4 mg (4 mg Intravenous Given 03/23/16 1709)     NEW OUTPATIENT MEDICATIONS STARTED DURING THIS VISIT:  New  Prescriptions   DOCUSATE SODIUM (COLACE) 100 MG CAPSULE    Take 1 tablet once or twice daily as needed for constipation while taking narcotic pain medicine   ONDANSETRON (ZOFRAN ODT) 4 MG DISINTEGRATING TABLET    Allow 1-2 tablets to dissolve in your mouth every 8 hours as needed for nausea/vomiting   OXYCODONE-ACETAMINOPHEN (ROXICET) 5-325 MG TABLET    Take 1-2 tablets by mouth every 4 (four) hours as needed for severe pain.   TAMSULOSIN (FLOMAX) 0.4 MG CAPS CAPSULE    Take 1 tablet by mouth daily until you pass the kidney stone or no longer have symptoms    Modified Medications   No medications on file    Discontinued Medications   No medications on file     Note:  This document was prepared using Dragon voice recognition software and may include unintentional dictation errors.    Loleta Rose, MD 03/23/16 1840

## 2017-02-16 ENCOUNTER — Emergency Department: Payer: Self-pay

## 2017-02-16 ENCOUNTER — Telehealth: Payer: Self-pay

## 2017-02-16 ENCOUNTER — Other Ambulatory Visit: Payer: Self-pay

## 2017-02-16 ENCOUNTER — Encounter: Payer: Self-pay | Admitting: Emergency Medicine

## 2017-02-16 ENCOUNTER — Emergency Department
Admission: EM | Admit: 2017-02-16 | Discharge: 2017-02-16 | Disposition: A | Discharge status: Home / Self Care | Payer: Self-pay | Attending: Emergency Medicine | Admitting: Emergency Medicine

## 2017-02-16 DIAGNOSIS — Z79899 Other long term (current) drug therapy: Secondary | ICD-10-CM | POA: Insufficient documentation

## 2017-02-16 DIAGNOSIS — R079 Chest pain, unspecified: Secondary | ICD-10-CM | POA: Insufficient documentation

## 2017-02-16 DIAGNOSIS — F419 Anxiety disorder, unspecified: Secondary | ICD-10-CM | POA: Insufficient documentation

## 2017-02-16 LAB — POCT PREGNANCY, URINE: PREG TEST UR: NEGATIVE

## 2017-02-16 LAB — CBC
HCT: 39.6 % (ref 35.0–47.0)
Hemoglobin: 13.7 g/dL (ref 12.0–16.0)
MCH: 30.8 pg (ref 26.0–34.0)
MCHC: 34.7 g/dL (ref 32.0–36.0)
MCV: 88.9 fL (ref 80.0–100.0)
PLATELETS: 246 10*3/uL (ref 150–440)
RBC: 4.45 MIL/uL (ref 3.80–5.20)
RDW: 13 % (ref 11.5–14.5)
WBC: 6.4 10*3/uL (ref 3.6–11.0)

## 2017-02-16 LAB — BASIC METABOLIC PANEL
Anion gap: 6 (ref 5–15)
BUN: 10 mg/dL (ref 6–20)
CALCIUM: 9.3 mg/dL (ref 8.9–10.3)
CHLORIDE: 107 mmol/L (ref 101–111)
CO2: 25 mmol/L (ref 22–32)
CREATININE: 0.83 mg/dL (ref 0.44–1.00)
GFR calc non Af Amer: >60 mL/min (ref >60)
Glucose, Bld: 103 mg/dL — ABNORMAL HIGH (ref 65–99)
Potassium: 3.7 mmol/L (ref 3.5–5.1)
SODIUM: 138 mmol/L (ref 135–145)

## 2017-02-16 LAB — TROPONIN I: Troponin I: <0.03 ng/mL (ref <0.03)

## 2017-02-16 MED ORDER — ASPIRIN 81 MG PO CHEW
324.0000 mg | CHEWABLE_TABLET | Freq: Once | ORAL | Status: AC
  Administered 2017-02-16: 324 mg via ORAL
  Filled 2017-02-16: qty 4

## 2017-02-16 NOTE — ED Provider Notes (Signed)
[facility] Emergency Department Provider Note  ____________________________________________  Time seen: Approximately 12:30 PM  I have reviewed the triage vital signs and the nursing notes.   HISTORY  Chief Complaint Chest Pain   HPI Alexis Horne is a 26 y.o. female with a history of anxiety who presents for evaluation of chest pain. Patient reports 1 week of constant pressure pain located in the left side of her chest associated with intermittent pinching sensation and intermittent mild shortness of breath. The pain is nonradiating. Patient reports intermittent tingling sensation of her hands and feet. She does have a history of anxiety and over the course of the last 2 weeks she has been under a lot of stress. 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 She does have family history both mother and father of ischemic heart disease. She is not a smoker. She denies any changes in her pain with exertion. She has no diaphoresis, no nausea or vomiting. No cough or congestion, no fever or chills, no abdominal pain. The pain is not pleuritic in nature, she denies personal or family history of blood clot, recent travel or immobilization, leg pain or swelling, hemoptysis, exogenous hormones.  History reviewed. No pertinent past medical history.  PMH Anxiety  No past surgical history on file.  Prior to Admission medications   Medication Sig Start Date End Date Taking? Authorizing Provider  docusate sodium (COLACE) 100 MG capsule Take 1 tablet once or twice daily as needed for constipation while taking narcotic pain medicine Patient not taking: Reported on 02/16/2017 03/23/16   Loleta Rose, MD  ondansetron (ZOFRAN ODT) 4 MG disintegrating tablet Allow 1-2 tablets to dissolve in your mouth every 8 hours as needed for nausea/vomiting Patient not taking: Reported on 02/16/2017  03/23/16   Loleta Rose, MD  oxyCODONE-acetaminophen (ROXICET) 5-325 MG tablet Take 1-2 tablets by mouth every 4 (four) hours as needed for severe pain. Patient not taking: Reported on 02/16/2017 03/23/16   Loleta Rose, MD  tamsulosin [facility]) 0.4 MG CAPS capsule Take 1 tablet by mouth daily until you pass the kidney stone or no longer have symptoms Patient not taking: Reported on 02/16/2017 03/23/16   Loleta Rose, MD    Allergies Ibuprofen  Family History Father - heart attack Mother - heart attack  Social History Social History  Substance Use Topics  . Smoking status: Never Smoker  . Smokeless tobacco: Not on file  . Alcohol use No    Review of Systems  Constitutional: Negative for fever. Eyes: Negative for visual changes. ENT: Negative for sore throat. Neck: No neck pain  Cardiovascular: + chest pain. Respiratory: + shortness of breath. Gastrointestinal: Negative for abdominal pain, vomiting or diarrhea. Genitourinary: Negative for dysuria. Musculoskeletal: Negative for back pain. Skin: Negative for rash. Neurological: Negative for headaches, weakness or numbness. Psych: No SI or HI. + anxiety  ____________________________________________   PHYSICAL EXAM:  VITAL SIGNS: ED Triage Vitals  Enc Vitals Group     BP 02/16/17 0932 133/80     Pulse Rate 02/16/17 0932 70     Resp 02/16/17 0932 18     Temp 02/16/17 0932 98.8 F (37.1 C)     Temp Source 02/16/17 0932 Oral     SpO2 02/16/17 0932 98 %     Weight 02/16/17 0933 230 lb (104.3 kg)     Height 02/16/17 0933 5\' 3"  (  1.6 m)     Head Circumference --      Peak Flow --      Pain Score 02/16/17 0933 6     Pain Loc --      Pain Edu? --      Excl. in GC? --     Constitutional: Alert and oriented. Well appearing and in no apparent distress. HEENT:      Head: Normocephalic and atraumatic.         Eyes: Conjunctivae are normal. Sclera is non-icteric.       Mouth/Throat: Mucous membranes are moist.       Neck:  Supple with no signs of meningismus. Cardiovascular: Regular rate and rhythm. No murmurs, gallops, or rubs. 2+ symmetrical distal pulses are present in all extremities. No JVD. Respiratory: Normal respiratory effort. Lungs are clear to auscultation bilaterally. No wheezes, crackles, or rhonchi.  Gastrointestinal: Soft, non tender, and non distended with positive bowel sounds. No rebound or guarding. Musculoskeletal: Nontender with normal range of motion in all extremities. No edema, cyanosis, or erythema of extremities. Neurologic: Normal speech and language. Face is symmetric. Moving all extremities. No gross focal neurologic deficits are appreciated. Skin: Skin is warm, dry and intact. No rash noted. Psychiatric: Mood and affect are normal. Speech and behavior are normal.  ____________________________________________   LABS (all labs ordered are listed, but only abnormal results are displayed)  Labs Reviewed  BASIC METABOLIC PANEL - Abnormal; Notable for the following:       Result Value   Glucose, Bld 103 (*)    All other components within normal limits  CBC  TROPONIN I  TROPONIN I   ____________________________________________  EKG  ED ECG REPORT I, Nita Sicklearolina Quanesha Klimaszewski, the attending physician, personally viewed and interpreted this ECG.  Normal sinus rhythm, rate of 66, normal intervals, normal axis, T-wave inversion in leads 3, V2 and V3 which is unchanged from prior from June 2017. ____________________________________________  RADIOLOGY  CXR: No edema or consolidation. ____________________________________________   PROCEDURES  Procedure(s) performed: None Procedures Critical Care performed:  None ____________________________________________   INITIAL IMPRESSION / ASSESSMENT AND PLAN / ED COURSE  26 y.o. female with a history of anxiety who presents for evaluation of chest pain constant for one week associated with mild shortness of breath and tingling of her  hands and feet. Patient has anxiety and has been under a lot of stress recently. Heart score of 1 for family history. First EKG with no ischemic changes. I do believe patient's presentation is due to her anxiety. However due to her strong family history of heart disease will get a second cardiac enzyme and give her an aspirin. We'll refer her for outpatient management of her anxiety and referred to cardiology for further evaluation. PERC negative.  ----------------------------------------- 12:33 PM on 02/16/2017 -----------------------------------------   OBSERVATION CARE: This patient is being placed under observation care for the following reasons: Chest pain with repeat testing to rule out ischemia   ----------------------------------------- 1:00 PM on 02/16/2017 ----------------------------------------- Patient remains stable. Calm in the ED.   ----------------------------------------- 1:57 PM on 02/16/2017 -----------------------------------------   END OF OBSERVATION STATUS: After an appropriate period of observation, this patient is being discharged due to the following reason(s): Troponin 2 negative. Patient's workup essentially negative. Patient remains extremely well appearing. She is going to be referred to cardiology for further evaluation. Patient provided with outpatient resources for management of her anxiety. Recommend return to the emergency room if she develops new or worsening chest pain, dizziness,  fever, or any other symptoms that are concerning to her.       Pertinent labs & imaging results that were available during my care of the patient were reviewed by me and considered in my medical decision making (see chart for details).    ____________________________________________   FINAL CLINICAL IMPRESSION(S) / ED DIAGNOSES  Final diagnoses:  Chest pain, unspecified type  Anxiety      NEW MEDICATIONS STARTED DURING THIS VISIT:  New Prescriptions   No  medications on file     Note:  This document was prepared using Dragon voice recognition software and may include unintentional dictation errors.    Don PerkingVeronese, [place], MD 02/16/17 [phone]

## 2017-02-16 NOTE — Telephone Encounter (Signed)
No answer no voicemail Needed to schedule an ED fu appointment was seen on 02/16/17 for CP   Will try again at a later time

## 2017-02-16 NOTE — Discharge Instructions (Signed)

## 2017-02-16 NOTE — ED Triage Notes (Signed)
Pt reports intermittent left side non radiating chest pain that has been worse the past two days. Pt reports associated nausea, shortness of breath and tingling of hands. Pt reports history of anxiety but this feels different.

## 2017-02-23 NOTE — Telephone Encounter (Signed)
No answer no voicemail Needed to schedule an ED fu appointment was seen on 02/16/17 for CP   Will try again at a later time

## 2017-02-25 NOTE — Telephone Encounter (Signed)
No answer no voicemail °Needed to schedule an ED fu appointment was seen on 02/16/17 for CP  °  °Will try again at a later time °

## 2017-07-15 ENCOUNTER — Other Ambulatory Visit: Payer: Self-pay

## 2017-07-15 ENCOUNTER — Emergency Department
Admission: EM | Admit: 2017-07-15 | Discharge: 2017-07-15 | Disposition: A | Discharge status: Home / Self Care | Payer: Medicaid Other | Attending: Student in an Organized Health Care Education/Training Program | Admitting: Student in an Organized Health Care Education/Training Program

## 2017-07-15 ENCOUNTER — Emergency Department: Payer: Medicaid Other

## 2017-07-15 ENCOUNTER — Encounter: Payer: Self-pay | Admitting: Radiology

## 2017-07-15 DIAGNOSIS — Z3A01 Less than 8 weeks gestation of pregnancy: Secondary | ICD-10-CM | POA: Diagnosis not present

## 2017-07-15 DIAGNOSIS — R1032 Left lower quadrant pain: Secondary | ICD-10-CM | POA: Insufficient documentation

## 2017-07-15 DIAGNOSIS — O9989 Other specified diseases and conditions complicating pregnancy, childbirth and the puerperium: Secondary | ICD-10-CM | POA: Insufficient documentation

## 2017-07-15 DIAGNOSIS — R102 Pelvic and perineal pain: Secondary | ICD-10-CM

## 2017-07-15 LAB — COMPREHENSIVE METABOLIC PANEL
ALT: 9 U/L — ABNORMAL LOW (ref 14–54)
AST: 21 U/L (ref 15–41)
Albumin: 4.1 g/dL (ref 3.5–5.0)
Alkaline Phosphatase: 63 U/L (ref 38–126)
Anion gap: 6 (ref 5–15)
BILIRUBIN TOTAL: 0.7 mg/dL (ref 0.3–1.2)
BUN: 9 mg/dL (ref 6–20)
CHLORIDE: 106 mmol/L (ref 101–111)
CO2: 24 mmol/L (ref 22–32)
Calcium: 9.2 mg/dL (ref 8.9–10.3)
Creatinine, Ser: 0.69 mg/dL (ref 0.44–1.00)
Glucose, Bld: 108 mg/dL — ABNORMAL HIGH (ref 65–99)
POTASSIUM: 3.4 mmol/L — AB (ref 3.5–5.1)
Sodium: 136 mmol/L (ref 135–145)
TOTAL PROTEIN: 7.5 g/dL (ref 6.5–8.1)

## 2017-07-15 LAB — URINALYSIS, COMPLETE (UACMP) WITH MICROSCOPIC
Bacteria, UA: NONE SEEN
Bilirubin Urine: NEGATIVE
GLUCOSE, UA: NEGATIVE mg/dL
HGB URINE DIPSTICK: NEGATIVE
KETONES UR: NEGATIVE mg/dL
Leukocytes, UA: NEGATIVE
NITRITE: NEGATIVE
PH: 5 (ref 5.0–8.0)
Protein, ur: NEGATIVE mg/dL
Specific Gravity, Urine: 1.026 (ref 1.005–1.030)

## 2017-07-15 LAB — CBC
HEMATOCRIT: 40.3 % (ref 35.0–47.0)
Hemoglobin: 13.7 g/dL (ref 12.0–16.0)
MCH: 30.8 pg (ref 26.0–34.0)
MCHC: 34 g/dL (ref 32.0–36.0)
MCV: 90.6 fL (ref 80.0–100.0)
PLATELETS: 248 10*3/uL (ref 150–440)
RBC: 4.45 MIL/uL (ref 3.80–5.20)
RDW: 12.4 % (ref 11.5–14.5)
WBC: 11.4 10*3/uL — AB (ref 3.6–11.0)

## 2017-07-15 LAB — HCG, QUANTITATIVE, PREGNANCY: hCG, Beta Chain, Quant, S: 3473 m[IU]/mL — ABNORMAL HIGH (ref <5)

## 2017-07-15 NOTE — ED Triage Notes (Signed)
Pt in with co lower abd pain and low back pain that started today. Found out 12/24 that she is pregnant, unsure of gestation. Denies any vag bleeding or dysuria.

## 2017-07-15 NOTE — ED Provider Notes (Signed)
[facility] Emergency Department Provider Note    First MD Initiated Contact with Patient 07/15/17 2128     (approximate)  I have reviewed the triage vital signs and the nursing notes.   HISTORY  Chief Complaint Abdominal Pain    HPI Alexis Horne is a 26 y.o. female who was previously healthy with no recent admissions to hospital presents with chief complaint of left lower quadrant pelvic pain is a going on but became acutely worse today.  Has noted over the past day or 2.  States she may have had some dysuria but no vaginal discharge or vaginal bleeding.  States that she learned on Christmas that she is pregnant.  Last menstrual cycle was in late October but has had a regular menses.  Denies any family history of bleeding disorders.  No diarrhea.  No fevers.  No nausea or vomiting.  This is her first pregnancy.  PMH: previously healthy FMH:  No bleeding disorders PSH: no recent surgeries There are no active problems to display for this patient.     Prior to Admission medications   Medication Sig Start Date End Date Taking? Authorizing Provider  docusate sodium (COLACE) 100 MG capsule Take 1 tablet once or twice daily as needed for constipation while taking narcotic pain medicine Patient not taking: Reported on 02/16/2017 03/23/16   Loleta RoseForbach, Cory, MD  ondansetron (ZOFRAN ODT) 4 MG disintegrating tablet Allow 1-2 tablets to dissolve in your mouth every 8 hours as needed for nausea/vomiting Patient not taking: Reported on 02/16/2017 03/23/16   Loleta RoseForbach, Cory, MD  oxyCODONE-acetaminophen (ROXICET) 5-325 MG tablet Take 1-2 tablets by mouth every 4 (four) hours as needed for severe pain. Patient not taking: Reported on 02/16/2017 03/23/16   Loleta RoseForbach, Cory, MD  tamsulosin [facility](FLOMAX) 0.4 MG CAPS capsule Take 1 tablet by mouth daily until you pass the kidney stone or no longer have symptoms Patient not taking: Reported on 02/16/2017 03/23/16   Loleta RoseForbach, Cory, MD     Allergies Ibuprofen    Social History Social History   Tobacco Use  . Smoking status: Never Smoker  Substance Use Topics  . Alcohol use: No  . Drug use: No    Review of Systems Patient denies headaches, rhinorrhea, blurry vision, numbness, shortness of breath, chest pain, edema, cough, abdominal pain, nausea, vomiting, diarrhea, dysuria, fevers, rashes or hallucinations unless otherwise stated above in HPI. ____________________________________________   PHYSICAL EXAM:  VITAL SIGNS: Vitals:   07/15/17 2040 07/15/17 2130  BP: (!) 143/61 126/78  Pulse: 92 88  Resp: 20   Temp: 98.6 F (37 C)   SpO2: 100% 100%    Constitutional: Alert and oriented. Well appearing and in no acute distress. Eyes: Conjunctivae are normal.  Head: Atraumatic. Nose: No congestion/rhinnorhea. Mouth/Throat: Mucous membranes are moist.   Neck: No stridor. Painless ROM.  Cardiovascular: Normal rate, regular rhythm. Grossly normal heart sounds.  Good peripheral circulation. Respiratory: Normal respiratory effort.  No retractions. Lungs CTAB. Gastrointestinal: Soft with mild llq pain but no rebound guarding or hernia. No distention. No abdominal bruits. No CVA tenderness. Genitourinary:  Musculoskeletal: No lower extremity tenderness nor edema.  No joint effusions. Neurologic:  Normal speech and language. No gross focal neurologic deficits are appreciated. No facial droop Skin:  Skin is warm, dry and intact. No rash noted. Psychiatric: Mood and affect are normal. Speech and behavior are normal.  ____________________________________________   LABS (all labs ordered are listed, but only abnormal results are displayed)  Results  for orders placed or performed during the hospital encounter of 07/15/17 (from the past 24 hour(s))  CBC     Status: Abnormal   Collection Time: 07/15/17  8:41 PM  Result Value Ref Range   WBC 11.4 (H) 3.6 - 11.0 K/uL   RBC 4.45 3.80 - 5.20 MIL/uL   Hemoglobin 13.7  12.0 - 16.0 g/dL   HCT 09.840.3 11.935.0 - 14.747.0 %   MCV 90.6 80.0 - 100.0 fL   MCH 30.8 26.0 - 34.0 pg   MCHC 34.0 32.0 - 36.0 g/dL   RDW 82.912.4 56.211.5 - 13.014.5 %   Platelets 248 150 - 440 K/uL  Comprehensive metabolic panel     Status: Abnormal   Collection Time: 07/15/17  8:41 PM  Result Value Ref Range   Sodium 136 135 - 145 mmol/L   Potassium 3.4 (L) 3.5 - 5.1 mmol/L   Chloride 106 101 - 111 mmol/L   CO2 24 22 - 32 mmol/L   Glucose, Bld 108 (H) 65 - 99 mg/dL   BUN 9 6 - 20 mg/dL   Creatinine, Ser 8.650.69 0.44 - 1.00 mg/dL   Calcium 9.2 8.9 - 78.410.3 mg/dL   Total Protein 7.5 6.5 - 8.1 g/dL   Albumin 4.1 3.5 - 5.0 g/dL   AST 21 15 - 41 U/L   ALT 9 (L) 14 - 54 U/L   Alkaline Phosphatase 63 38 - 126 U/L   Total Bilirubin 0.7 0.3 - 1.2 mg/dL   GFR calc non Af Amer >60 >60 mL/min   GFR calc Af Amer >60 >60 mL/min   Anion gap 6 5 - 15  Urinalysis, Complete w Microscopic     Status: Abnormal   Collection Time: 07/15/17  8:41 PM  Result Value Ref Range   Color, Urine YELLOW (A) YELLOW   APPearance CLEAR (A) CLEAR   Specific Gravity, Urine 1.026 1.005 - 1.030   pH 5.0 5.0 - 8.0   Glucose, UA NEGATIVE NEGATIVE mg/dL   Hgb urine dipstick NEGATIVE NEGATIVE   Bilirubin Urine NEGATIVE NEGATIVE   Ketones, ur NEGATIVE NEGATIVE mg/dL   Protein, ur NEGATIVE NEGATIVE mg/dL   Nitrite NEGATIVE NEGATIVE   Leukocytes, UA NEGATIVE NEGATIVE   RBC / HPF 0-5 0 - 5 RBC/hpf   WBC, UA 0-5 0 - 5 WBC/hpf   Bacteria, UA NONE SEEN NONE SEEN   Squamous Epithelial / LPF 0-5 (A) NONE SEEN   Mucus PRESENT   hCG, quantitative, pregnancy     Status: Abnormal   Collection Time: 07/15/17  8:41 PM  Result Value Ref Range   hCG, Beta Chain, Quant, S 3,473 (H) <5 mIU/mL   ____________________________________________ ____________________________________________  RADIOLOGY  I personally reviewed all radiographic images ordered to evaluate for the above acute complaints and reviewed radiology reports and findings.  These  findings were personally discussed with the patient.  Please see medical record for radiology report.  ____________________________________________   PROCEDURES  Procedure(s) performed:  Procedures    Critical Care performed: no ____________________________________________   INITIAL IMPRESSION / ASSESSMENT AND PLAN / ED COURSE  Pertinent labs & imaging results that were available during my care of the patient were reviewed by me and considered in my medical decision making (see chart for details).  DDX: ectopic, roundligament pain, toa, torsion, cyst  Alexis Horne is a 26 y.o. who presents to the ED with pelvic pain in early pregnancy.  Pain is located in left lower quadrant.  Not consistent with acute appendicitis.  No evidence of UTI.  Blood work is reassuring.  Mild leukocytosis consistent with early pregnancy.  Patient denies any vaginal discharge or vaginal bleeding.  Based on location of pain will order ultrasound to confirm intrauterine pregnancy and exclude any evidence of ectopic torsion or cyst.  Clinical Course as of Jul 16 2327  Wed Jul 15, 2017  2324 Patient reassessed.  Ultrasound confirms intrauterine pregnancy with no evidence of torsion or evidence of acute process in the pelvis.  Patient remains pain-free on repeat abdominal exam.  Patient is stable and appropriate for follow-up with OB/GYN as outpatient.  [PR]    Clinical Course User Index [PR] Willy Eddy, MD     ____________________________________________   FINAL CLINICAL IMPRESSION(S) / ED DIAGNOSES  Final diagnoses:  LLQ pain  Pelvic pain in female  Less than [redacted] weeks gestation of pregnancy      NEW MEDICATIONS STARTED DURING THIS VISIT:  This SmartLink is deprecated. Use AVSMEDLIST instead to display the medication list for a patient.   Note:  This document was prepared using Dragon voice recognition software and may include unintentional dictation errors.    Willy Eddy, MD 07/15/17 2329

## 2017-07-15 NOTE — ED Notes (Signed)

## 2017-07-15 NOTE — Discharge Instructions (Signed)

## 2017-07-15 NOTE — ED Notes (Addendum)
Pt states she feels like she is having a cramping sensation in her lower abdomen on both sides that is wrapping around to her back. Denies any vaginal bleeding

## 2017-07-21 NOTE — L&D Delivery Note (Signed)
Obstetrical Delivery Note   Date of Delivery:   03/12/2018 Primary OB:   [facility] Gestational Age/EDD: [id number] (dated by 1st trimester ultrasound) Antepartum complications: obesity  Delivered By:   Farrel Connersolleen Raeghan Demeter, CNM  Delivery Type:   spontaneous vaginal delivery  Procedure Details:   Mother pushed to deliver a vigorous female infant in OA at 1219 PM. CAN x1 reduced over shoulder. Baby suctioned and dried and placed on mother's abdomen. After delayed cord clamping the cord was cut by the father of the baby and baby was placed skin to skin with mother. Spontaneous delivery of intact placenta and 3 vessel cord, followed by uterine atony. Uterine atony resolved with bimanual, IV Pitocin, IM Methergine and Cytotec 800 mcg PR. Repair of second degree perineal laceration with 3-0 Chromic and 2-0 Vicryl in layers under epidural and local anesthesia with 1% Lidocaine.. There was a tear at the right hymenal ring that was also repaired due to bleeding and was the probable cause of the bleeding prior to delivery. EBL: Anesthesia:    local and epidural Intrapartum complications: Uterine atony GBS:    negative Laceration:    Second degree perineal; laceration at right hymenal ring Episiotomy:    none Placenta:    Via active 3rd stage. To pathology: no Estimated Blood Loss:   Baby:    Liveborn female, Apgars 8/9, weight 7#6oz    Farrel Connersolleen Meir [place]wood, CNM

## 2017-08-19 ENCOUNTER — Ambulatory Visit (INDEPENDENT_AMBULATORY_CARE_PROVIDER_SITE_OTHER): Payer: Medicaid Other | Admitting: Maternal Newborn

## 2017-08-19 ENCOUNTER — Encounter: Payer: Self-pay | Admitting: Maternal Newborn

## 2017-08-19 VITALS — BP 120/80 | Wt 239.0 lb

## 2017-08-19 DIAGNOSIS — Z3400 Encounter for supervision of normal first pregnancy, unspecified trimester: Secondary | ICD-10-CM

## 2017-08-19 DIAGNOSIS — O0990 Supervision of high risk pregnancy, unspecified, unspecified trimester: Secondary | ICD-10-CM | POA: Insufficient documentation

## 2017-08-19 DIAGNOSIS — Z1379 Encounter for other screening for genetic and chromosomal anomalies: Secondary | ICD-10-CM

## 2017-08-19 DIAGNOSIS — Z6841 Body Mass Index (BMI) 40.0 and over, adult: Secondary | ICD-10-CM

## 2017-08-19 NOTE — Patient Instructions (Signed)
First Trimester of Pregnancy The first trimester of pregnancy is from week 1 until the end of week 13 (months 1 through 3). A week after a sperm fertilizes an egg, the egg will implant on the wall of the uterus. This embryo will begin to develop into a baby. Genes from you and your partner will form the baby. The female genes will determine whether the baby will be a boy or a girl. At 6-8 weeks, the eyes and face will be formed, and the heartbeat can be seen on ultrasound. At the end of 12 weeks, all the baby's organs will be formed. Now that you are pregnant, you will want to do everything you can to have a healthy baby. Two of the most important things are to get good prenatal care and to follow your health care provider's instructions. Prenatal care is all the medical care you receive before the baby's birth. This care will help prevent, find, and treat any problems during the pregnancy and childbirth. Body changes during your first trimester Your body goes through many changes during pregnancy. The changes vary from woman to woman.  You may gain or lose a couple of pounds at first.  You may feel sick to your stomach (nauseous) and you may throw up (vomit). If the vomiting is uncontrollable, call your health care provider.  You may tire easily.  You may develop headaches that can be relieved by medicines. All medicines should be approved by your health care provider.  You may urinate more often. Painful urination may mean you have a bladder infection.  You may develop heartburn as a result of your pregnancy.  You may develop constipation because certain hormones are causing the muscles that push stool through your intestines to slow down.  You may develop hemorrhoids or swollen veins (varicose veins).  Your breasts may begin to grow larger and become tender. Your nipples may stick out more, and the tissue that surrounds them (areola) may become darker.  Your gums may bleed and may be  sensitive to brushing and flossing.  Dark spots or blotches (chloasma, mask of pregnancy) may develop on your face. This will likely fade after the baby is born.  Your menstrual periods will stop.  You may have a loss of appetite.  You may develop cravings for certain kinds of food.  You may have changes in your emotions from day to day, such as being excited to be pregnant or being concerned that something may go wrong with the pregnancy and baby.  You may have more vivid and strange dreams.  You may have changes in your hair. These can include thickening of your hair, rapid growth, and changes in texture. Some women also have hair loss during or after pregnancy, or hair that feels dry or thin. Your hair will most likely return to normal after your baby is born.  What to expect at prenatal visits During a routine prenatal visit:  You will be weighed to make sure you and the baby are growing normally.  Your blood pressure will be taken.  Your abdomen will be measured to track your baby's growth.  The fetal heartbeat will be listened to between weeks 10 and 14 of your pregnancy.  Test results from any previous visits will be discussed.  Your health care provider may ask you:  How you are feeling.  If you are feeling the baby move.  If you have had any abnormal symptoms, such as leaking fluid, bleeding, severe headaches,   or abdominal cramping.  If you are using any tobacco products, including cigarettes, chewing tobacco, and electronic cigarettes.  If you have any questions.  Other tests that may be performed during your first trimester include:  Blood tests to find your blood type and to check for the presence of any previous infections. The tests will also be used to check for low iron levels (anemia) and protein on red blood cells (Rh antibodies). Depending on your risk factors, or if you previously had diabetes during pregnancy, you may have tests to check for high blood  sugar that affects pregnant women (gestational diabetes).  Urine tests to check for infections, diabetes, or protein in the urine.  An ultrasound to confirm the proper growth and development of the baby.  Fetal screens for spinal cord problems (spina bifida) and Down syndrome.  HIV (human immunodeficiency virus) testing. Routine prenatal testing includes screening for HIV, unless you choose not to have this test.  You may need other tests to make sure you and the baby are doing well.  Follow these instructions at home: Medicines  Follow your health care provider's instructions regarding medicine use. Specific medicines may be either safe or unsafe to take during pregnancy.  Take a prenatal vitamin that contains at least 600 micrograms (mcg) of folic acid.  If you develop constipation, try taking a stool softener if your health care provider approves. Eating and drinking  Eat a balanced diet that includes fresh fruits and vegetables, whole grains, good sources of protein such as meat, eggs, or tofu, and low-fat dairy. Your health care provider will help you determine the amount of weight gain that is right for you.  Avoid raw meat and uncooked cheese. These carry germs that can cause birth defects in the baby.  Eating four or five small meals rather than three large meals a day may help relieve nausea and vomiting. If you start to feel nauseous, eating a few soda crackers can be helpful. Drinking liquids between meals, instead of during meals, also seems to help ease nausea and vomiting.  Limit foods that are high in fat and processed sugars, such as fried and sweet foods.  To prevent constipation: ? Eat foods that are high in fiber, such as fresh fruits and vegetables, whole grains, and beans. ? Drink enough fluid to keep your urine clear or pale yellow. Activity  Exercise only as directed by your health care provider. Most women can continue their usual exercise routine during  pregnancy. Try to exercise for 30 minutes at least 5 days a week. Exercising will help you: ? Control your weight. ? Stay in shape. ? Be prepared for labor and delivery.  Experiencing pain or cramping in the lower abdomen or lower back is a good sign that you should stop exercising. Check with your health care provider before continuing with normal exercises.  Try to avoid standing for long periods of time. Move your legs often if you must stand in one place for a long time.  Avoid heavy lifting.  Wear low-heeled shoes and practice good posture.  You may continue to have sex unless your health care provider tells you not to. Relieving pain and discomfort  Wear a good support bra to relieve breast tenderness.  Take warm sitz baths to soothe any pain or discomfort caused by hemorrhoids. Use hemorrhoid cream if your health care provider approves.  Rest with your legs elevated if you have leg cramps or low back pain.  If you develop   varicose veins in your legs, wear support hose. Elevate your feet for 15 minutes, 3-4 times a day. Limit salt in your diet. Prenatal care  Schedule your prenatal visits by the twelfth week of pregnancy. They are usually scheduled monthly at first, then more often in the last 2 months before delivery.  Write down your questions. Take them to your prenatal visits.  Keep all your prenatal visits as told by your health care provider. This is important. Safety  Wear your seat belt at all times when driving.  Make a list of emergency phone numbers, including numbers for family, friends, the hospital, and police and fire departments. General instructions  Ask your health care provider for a referral to a local prenatal education class. Begin classes no later than the beginning of month 6 of your pregnancy.  Ask for help if you have counseling or nutritional needs during pregnancy. Your health care provider can offer advice or refer you to specialists for help  with various needs.  Do not use hot tubs, steam rooms, or saunas.  Do not douche or use tampons or scented sanitary pads.  Do not cross your legs for long periods of time.  Avoid cat litter boxes and soil used by cats. These carry germs that can cause birth defects in the baby and possibly loss of the fetus by miscarriage or stillbirth.  Avoid all smoking, herbs, alcohol, and medicines not prescribed by your health care provider. Chemicals in these products affect the formation and growth of the baby.  Do not use any products that contain nicotine or tobacco, such as cigarettes and e-cigarettes. If you need help quitting, ask your health care provider. You may receive counseling support and other resources to help you quit.  Schedule a dentist appointment. At home, brush your teeth with a soft toothbrush and be gentle when you floss. Contact a health care provider if:  You have dizziness.  You have mild pelvic cramps, pelvic pressure, or nagging pain in the abdominal area.  You have persistent nausea, vomiting, or diarrhea.  You have a bad smelling vaginal discharge.  You have pain when you urinate.  You notice increased swelling in your face, hands, legs, or ankles.  You are exposed to fifth disease or chickenpox.  You are exposed to German measles (rubella) and have never had it. Get help right away if:  You have a fever.  You are leaking fluid from your vagina.  You have spotting or bleeding from your vagina.  You have severe abdominal cramping or pain.  You have rapid weight gain or loss.  You vomit blood or material that looks like coffee grounds.  You develop a severe headache.  You have shortness of breath.  You have any kind of trauma, such as from a fall or a car accident. Summary  The first trimester of pregnancy is from week 1 until the end of week 13 (months 1 through 3).  Your body goes through many changes during pregnancy. The changes vary from  woman to woman.  You will have routine prenatal visits. During those visits, your health care provider will examine you, discuss any test results you may have, and talk with you about how you are feeling. This information is not intended to replace advice given to you by your health care provider. Make sure you discuss any questions you have with your health care provider. Document Released: 07/01/2001 Document Revised: 06/18/2016 Document Reviewed: 06/18/2016 [employer] Interactive Patient Education  2018 [employer]  

## 2017-08-19 NOTE — Progress Notes (Signed)
C/O cramping left side.rj

## 2017-08-19 NOTE — Progress Notes (Signed)
08/19/2017   Chief Complaint: Amenorrhea, positive home pregnancy test, desires prenatal care.  Transfer of Care Patient: no  History of Present Illness: Ms. Anstine is a 27 y.o. G1P0 10w 2d based on Ultrasound with an Estimated Date of Delivery: 03/15/2018, with the above CC.   Her periods were: irregular periods approximately every other month. She was using no method when she conceived.  She has Negative signs or symptoms of nausea/vomiting of pregnancy. She has Negative signs or symptoms of miscarriage or preterm labor She identifies Negative Zika risk factors for her and her partner On any different medications around the time she conceived/early pregnancy: No  History of varicella: No   ROS: A 12-point review of systems was performed and negative, except as stated in the above HPI.  OBGYN History: As per HPI. OB History  Gravida Para Term Preterm AB Living  1            SAB TAB Ectopic Multiple Live Births               # Outcome Date GA Lbr Len/2nd Weight Sex Delivery Anes PTL Lv  1 Current               Any issues with any prior pregnancies: not applicable Any prior children are healthy, doing well, without any problems or issues: not applicable History of pap smears: Yes. Last pap smear 2016. NILM per patient. She declines Pap today due to spotting with Pap, needs postpartum History of STIs: No   Past Medical History: No past medical history on file.  Past Surgical History: No past surgical history on file.  Family History:  No family history on file. She denies any female cancers, bleeding or blood clotting disorders.  She denies any history of intellectual disability, birth defects or genetic disorders in her or the FOB's history  Social History:  Social History   Socioeconomic History  . Marital status: Single    Spouse name: Not on file  . Number of children: Not on file  . Years of education: Not on file  . Highest education level: Not on file    Social Needs  . Financial resource strain: Not on file  . Food insecurity - worry: Not on file  . Food insecurity - inability: Not on file  . Transportation needs - medical: Not on file  . Transportation needs - non-medical: Not on file  Occupational History  . Not on file  Tobacco Use  . Smoking status: Never Smoker  Substance and Sexual Activity  . Alcohol use: No  . Drug use: No  . Sexual activity: Not on file  Other Topics Concern  . Not on file  Social History Narrative  . Not on file   Any cats in the household: no No history of or current domestic violence.  Allergy: Allergies  Allergen Reactions  . Ibuprofen Swelling  . Kiwi Extract     Swollen and itchy    Current Outpatient Medications:  Current Outpatient Medications:  .  prenatal vitamin w/FE, FA (PRENATAL 1 + 1) 27-1 MG TABS tablet, Take 1 tablet by mouth daily at 12 noon., Disp: , Rfl:  .  docusate sodium (COLACE) 100 MG capsule, Take 1 tablet once or twice daily as needed for constipation while taking narcotic pain medicine (Patient not taking: Reported on 02/16/2017), Disp: 30 capsule, Rfl: 0 .  ondansetron (ZOFRAN ODT) 4 MG disintegrating tablet, Allow 1-2 tablets to dissolve in your mouth every 8  hours as needed for nausea/vomiting (Patient not taking: Reported on 02/16/2017), Disp: 30 tablet, Rfl: 0 .  oxyCODONE-acetaminophen (ROXICET) 5-325 MG tablet, Take 1-2 tablets by mouth every 4 (four) hours as needed for severe pain. (Patient not taking: Reported on 02/16/2017), Disp: 30 tablet, Rfl: 0 .  tamsulosin (FLOMAX) 0.4 MG CAPS capsule, Take 1 tablet by mouth daily until you pass the kidney stone or no longer have symptoms (Patient not taking: Reported on 02/16/2017), Disp: 14 capsule, Rfl: 0   Physical Exam:   BP 120/80   Wt 239 lb (108.4 kg)   LMP 05/04/2017   BMI 42.34 kg/m  Body mass index is 42.34 kg/m. Constitutional: Well nourished, well developed female in no acute distress.  Neck:  Supple,  normal appearance, and no thyromegaly  Cardiovascular: S1, S2 normal, no murmur, rub or gallop, regular rate and rhythm Respiratory:  Clear to auscultation bilateral. Normal respiratory effort Abdomen: positive bowel sounds and no masses, hernias; diffusely non tender to palpation, non distended Breasts: breasts appear normal, no suspicious masses, no skin or nipple changes or axillary nodes, right breast normal without mass, skin or nipple changes or axillary nodes, left breast normal without mass, skin or nipple changes or axillary nodes, risk and benefit of breast self-exam was discussed. Neuro/Psych:  Normal mood and affect.  Skin:  Warm and dry.  Lymphatic:  No inguinal lymphadenopathy.   Pelvic exam: is limited by body habitus External genitalia, Bartholin's glands, Urethra, Skene's glands: within normal limits Vagina: within normal limits and with no blood in the vault  Cervix: normal appearing cervix without discharge or lesions, closed/long/high Uterus:  enlarged, c/w 10 week size Adnexa:  no mass, fullness, tenderness  Assessment: Ms. Dannielle HuhBlevins is a 27 y.o. G1P0 at 4010w 2d based on Ultrasound with an Estimated Date of Delivery: 03/15/2017, presenting for prenatal care.  Plan:  1) Avoid alcoholic beverages. 2) Patient encouraged not to smoke.  3) Discontinue the use of all non-medicinal drugs and chemicals.  4) Take prenatal vitamins daily.  5) Seatbelt use advised. 6) Nutrition, food safety (fish, cheese advisories, and high nitrite foods) and exercise discussed. 7) Hospital and practice style and [facility] discussed. 8) Patient is asked about travel to areas at risk for the Zika virus, and counseled to avoid travel and exposure to mosquitoes or sexual partners who may have themselves been exposed to the virus. Testing is discussed, and will be ordered as appropriate.  9) [facility] advised. 10) Genetic Screening, such as with 1st Trimester Screening, cell  free fetal DNA, AFP testing, and Ultrasound, as well as with amniocentesis and CVS as appropriate, is discussed with patient. She plans to have genetic testing this pregnancy. [id number] ordered today. 11) Dating based [facility] [place] 4238w2d. 12) BMI>40, early GTT ordered and she will return for lab visit.  Return in about 4 weeks (around 09/16/2017) for ROB. Problem list reviewed and updated.  Marcelyn BruinsJacelyn Ziaire Hagos, CNM [facility], [facility] 08/19/2017  10:20 AM

## 2017-08-20 LAB — RPR+RH+ABO+RUB AB+AB SCR+CB...
Antibody Screen: NEGATIVE
HEMATOCRIT: 39.1 % (ref 34.0–46.6)
HEP B S AG: NEGATIVE
HIV SCREEN 4TH GENERATION: NONREACTIVE
Hemoglobin: 13.6 g/dL (ref 11.1–15.9)
MCH: 30.6 pg (ref 26.6–33.0)
MCHC: 34.8 g/dL (ref 31.5–35.7)
MCV: 88 fL (ref 79–97)
Platelets: 244 10*3/uL (ref 150–379)
RBC: 4.45 x10E6/uL (ref 3.77–5.28)
RDW: 13.7 % (ref 12.3–15.4)
RH TYPE: NEGATIVE
RPR: NONREACTIVE
Varicella zoster IgG: >4000 index (ref >165)
WBC: 8.2 10*3/uL (ref 3.4–10.8)

## 2017-08-21 LAB — URINE CULTURE

## 2017-08-21 LAB — URINE DRUG PANEL 7
Amphetamines, Urine: NEGATIVE ng/mL
Barbiturate Quant, Ur: NEGATIVE ng/mL
Benzodiazepine Quant, Ur: NEGATIVE ng/mL
CANNABINOID QUANT UR: NEGATIVE ng/mL
COCAINE (METAB.): NEGATIVE ng/mL
Opiate Quant, Ur: NEGATIVE ng/mL
PCP QUANT UR: NEGATIVE ng/mL

## 2017-08-21 LAB — GC/CHLAMYDIA PROBE AMP
CHLAMYDIA, DNA PROBE: NEGATIVE
Neisseria gonorrhoeae by PCR: NEGATIVE

## 2017-08-24 ENCOUNTER — Telehealth: Payer: Self-pay

## 2017-08-24 LAB — MATERNIT 21 PLUS CORE, BLOOD
CHROMOSOME 18: NEGATIVE
Chromosome 13: NEGATIVE
Chromosome 21: NEGATIVE
Y Chromosome: DETECTED

## 2017-08-24 NOTE — Telephone Encounter (Signed)
Pt calling for gender results.  Adv they won't be back until the end of this week.

## 2017-08-27 ENCOUNTER — Other Ambulatory Visit: Payer: Self-pay | Admitting: Maternal Newborn

## 2017-08-27 DIAGNOSIS — Z3400 Encounter for supervision of normal first pregnancy, unspecified trimester: Secondary | ICD-10-CM

## 2017-08-28 ENCOUNTER — Other Ambulatory Visit: Payer: Medicaid Other

## 2017-08-28 DIAGNOSIS — Z6841 Body Mass Index (BMI) 40.0 and over, adult: Secondary | ICD-10-CM

## 2017-08-28 DIAGNOSIS — Z3400 Encounter for supervision of normal first pregnancy, unspecified trimester: Secondary | ICD-10-CM

## 2017-08-29 LAB — GLUCOSE, 1 HOUR GESTATIONAL: Gestational Diabetes Screen: 73 mg/dL (ref 65–139)

## 2017-08-31 ENCOUNTER — Telehealth: Payer: Self-pay

## 2017-08-31 NOTE — Telephone Encounter (Signed)
Correction, CB# [phone]

## 2017-08-31 NOTE — Telephone Encounter (Signed)
Pt calling triage today requesting lab results from last visit. CB [phone]

## 2017-09-11 ENCOUNTER — Other Ambulatory Visit: Payer: Self-pay | Admitting: Maternal Newborn

## 2017-09-11 ENCOUNTER — Telehealth: Payer: Self-pay

## 2017-09-11 DIAGNOSIS — O219 Vomiting of pregnancy, unspecified: Secondary | ICD-10-CM

## 2017-09-11 MED ORDER — PROMETHAZINE HCL 25 MG PO TABS: 25.0000 mg | ORAL_TABLET | Freq: Four times a day (QID) | ORAL | 0 refills | Status: DC | PRN

## 2017-09-11 NOTE — Telephone Encounter (Signed)
Pt states she thinks she caught a virus at work. S&S started today. She has fever 101 w/n/v/d. She took Tylenol at 1030. Fever reduced to 98. Pt has been pushing water & gatorade however, unable to keep down/in d/t n/v/d. Pt doesn't currently have any antiemtics  Or antidiarrhea meds on hand. Advised to monitor for s&s of dehydration.

## 2017-09-11 NOTE — Telephone Encounter (Signed)
Pt states she has not been feeling the best. She has had n/v/d. Inquiring what she can take. ZO#[phone]#[phone]

## 2017-09-11 NOTE — Telephone Encounter (Signed)
Per JS, rx for phenergan sent in. Pt aware to p/u & can use OTC Immodium or Kaopectate for diarrhea.

## 2017-09-18 ENCOUNTER — Ambulatory Visit (INDEPENDENT_AMBULATORY_CARE_PROVIDER_SITE_OTHER): Payer: Medicaid Other | Admitting: Obstetrics and Gynecology

## 2017-09-18 VITALS — BP 122/78 | Wt 237.0 lb

## 2017-09-18 DIAGNOSIS — Z3A14 14 weeks gestation of pregnancy: Secondary | ICD-10-CM | POA: Diagnosis not present

## 2017-09-18 DIAGNOSIS — Z23 Encounter for immunization: Secondary | ICD-10-CM | POA: Diagnosis not present

## 2017-09-18 DIAGNOSIS — Z3400 Encounter for supervision of normal first pregnancy, unspecified trimester: Secondary | ICD-10-CM

## 2017-09-18 DIAGNOSIS — Z363 Encounter for antenatal screening for malformations: Secondary | ICD-10-CM

## 2017-09-18 NOTE — Progress Notes (Signed)
Routine Prenatal Care Visit  Subjective  Alexis DallyCheyenne E Gentzler is a 27 y.o. G1P0 at 7319w4d being seen today for ongoing prenatal care.  She is currently monitored for the following issues for this high-risk pregnancy and has Supervision of normal first pregnancy, antepartum and BMI 40.0-44.9, adult (HCC) on their problem list.  ----------------------------------------------------------------------------------- Patient reports no complaints.   Contractions: Not present. Vag. Bleeding: None.  Movement: Present. Denies leaking of fluid.  ----------------------------------------------------------------------------------- The following portions of the patient's history were reviewed and updated as appropriate: allergies, current medications, past family history, past medical history, past social history, past surgical history and problem list. Problem list updated.   Objective  Blood pressure 122/78, weight 237 lb (107.5 kg), last menstrual period 05/04/2017. Pregravid weight 230 lb (104.3 kg) Total Weight Gain 7 lb (3.175 kg) Urinalysis: Urine Protein: Negative Urine Glucose: Negative  Fetal Status: Fetal Heart Rate (bpm): 155   Movement: Present     General:  Alert, oriented and cooperative. Patient is in no acute distress.  Skin: Skin is warm and dry. No rash noted.   Cardiovascular: Normal heart rate noted  Respiratory: Normal respiratory effort, no problems with respiration noted  Abdomen: Soft, gravid, appropriate for gestational age. Pain/Pressure: Absent     Pelvic:  Cervical exam deferred        Extremities: Normal range of motion.     ental Status: Normal mood and affect. Normal behavior. Normal judgment and thought content.     Assessment   27 y.o. G1P0 at 4719w4d by  03/15/2018, by Ultrasound presenting for routine prenatal visit  Plan   FIRST Problems (from 08/19/17 to present)    Problem Noted Resolved   Supervision of normal first pregnancy, antepartum 08/19/2017 by  Oswaldo ConroySchmid, Jacelyn Y, CNM No   Overview Addendum 09/18/2017  4:09 PM by Vena AustriaStaebler, Omkar Stratmann, MD    Clinic [facility] Prenatal Labs  Dating 5 week US Blood type: A/Negative/-- (01/30 0953)   Genetic Screen NIPS: Negative, XY Antibody:Negative (01/30 0953)  Anatomic US  Rubella: <0.90 (01/30 0953) Varicella: Immune  GTT Early:73 Third trimester:  RPR: Non Reactive (01/30 0953)   Rhogam N/A HBsAg: Negative (01/30 0953)   TDaP vaccine                       Flu Shot: HIV: Non Reactive (01/30 0953)   Baby Food                                GBS:   Contraception  Pap: Needs postpartum  CBB     CS/VBAC    Support Person                  Gestational age appropriate obstetric precautions including but not limited to vaginal bleeding, contractions, leaking of fluid and fetal movement were reviewed in detail with the patient.   Discussed benefits of influenza vaccination during pregnancy.    The CDC recommends influenza vaccination for all pregnant patient regardless of trimester.  Besides the benefits of preventing influenza in the mother we discussed transfer of maternal antibodies to fetus as well as secretion of antibodies in breast milk which are protective for the infant.  In case of positive influenza test in the patient or a close household contact, the use of Tamiflu is also recommended by the CDC.   - INFLUENZA VACCINATION TODAY - ANATOMY SCAN NEXT VISIT  Return  in about 4 weeks (around 10/16/2017) for ROB and anatomy scan.  Vena Austria, MD, Evern Core [facility], [facility] 09/18/2017, 4:30 PM

## 2017-09-18 NOTE — Progress Notes (Signed)
ROB Headaches 

## 2017-09-28 ENCOUNTER — Encounter: Payer: Self-pay | Admitting: Emergency Medicine

## 2017-09-28 ENCOUNTER — Emergency Department
Admission: EM | Admit: 2017-09-28 | Discharge: 2017-09-28 | Disposition: A | Discharge status: Home / Self Care | Payer: Medicaid Other | Attending: Emergency Medicine | Admitting: Emergency Medicine

## 2017-09-28 DIAGNOSIS — R05 Cough: Secondary | ICD-10-CM | POA: Diagnosis not present

## 2017-09-28 DIAGNOSIS — R197 Diarrhea, unspecified: Secondary | ICD-10-CM | POA: Diagnosis not present

## 2017-09-28 DIAGNOSIS — Z3A16 16 weeks gestation of pregnancy: Secondary | ICD-10-CM | POA: Diagnosis not present

## 2017-09-28 DIAGNOSIS — O9989 Other specified diseases and conditions complicating pregnancy, childbirth and the puerperium: Secondary | ICD-10-CM | POA: Diagnosis not present

## 2017-09-28 DIAGNOSIS — O219 Vomiting of pregnancy, unspecified: Secondary | ICD-10-CM | POA: Diagnosis present

## 2017-09-28 DIAGNOSIS — R112 Nausea with vomiting, unspecified: Secondary | ICD-10-CM

## 2017-09-28 LAB — INFLUENZA PANEL BY PCR (TYPE A & B)
INFLAPCR: NEGATIVE
Influenza B By PCR: NEGATIVE

## 2017-09-28 NOTE — ED Triage Notes (Signed)
Pt denies symptoms today.

## 2017-09-28 NOTE — ED Triage Notes (Signed)
First Nurse Note:  Sent from work to be tested for flu.  AAOx3.  Skin awrm and dry. NAD

## 2017-09-28 NOTE — Discharge Instructions (Signed)
Follow-up with your regular doctor if not better in 3-5 days.  Your nausea medicine that you have at home.  If the diarrhea becomes worse use over-the-counter Imodium.  I would prefer he just eat a BRAT diet at this time.

## 2017-09-28 NOTE — ED Triage Notes (Signed)
Pt reports last night had some vomiting and diarrhea. 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

## 2017-09-28 NOTE — ED Provider Notes (Signed)
[facility] Emergency Department Provider Note  ____________________________________________   None    (approximate)  I have reviewed the triage vital signs and the nursing notes.   HISTORY  Chief Complaint Emesis and Diarrhea    HPI Alexis Horne is a 27 y.o. female resents emergency department stated that she has had nausea vomiting and diarrhea x3 in the last 24 hours.  She denies fever or chills.  She does have a cough and congestion.  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  She denies any chest pain or shortness of breath.  She denies any abdominal pain.  She denies any vaginal discharge.  She states she is [redacted] weeks pregnant.  History reviewed. No pertinent past medical history.  Patient Active Problem List   Diagnosis Date Noted  . Supervision of normal first pregnancy, antepartum 08/19/2017  . BMI 40.0-44.9, adult (HCC) 08/19/2017    History reviewed. No pertinent surgical history.  Prior to Admission medications   Medication Sig Start Date End Date Taking? Authorizing Provider  prenatal vitamin w/FE, FA (PRENATAL 1 + 1) 27-1 MG TABS tablet Take 1 tablet by mouth daily at 12 noon.    [provider]    Allergies Ibuprofen and Kiwi extract  No family history on file.  Social History Social History   Tobacco Use  . Smoking status: Never Smoker  . Smokeless tobacco: Never Used  Substance Use Topics  . Alcohol use: No  . Drug use: No    Review of Systems  Constitutional: No fever/chills Eyes: No visual changes. ENT: No sore throat. Respiratory: Positive cough Gastrointestinal: Positive for vomiting and diarrhea Genitourinary: Negative for dysuria. Musculoskeletal: Negative for back pain. Skin: Negative for rash.    ____________________________________________   PHYSICAL EXAM:  VITAL SIGNS: ED Triage Vitals  Enc Vitals Group     BP 09/28/17 1240 122/79     Pulse Rate  09/28/17 1240 100     Resp 09/28/17 1240 17     Temp 09/28/17 1240 98.6 F (37 C)     Temp Source 09/28/17 1240 Oral     SpO2 09/28/17 1240 99 %     Weight 09/28/17 1257 230 lb (104.3 kg)     Height 09/28/17 1257 5\' 3"  (1.6 m)     Head Circumference --      Peak Flow --      Pain Score 09/28/17 1319 0     Pain Loc --      Pain Edu? --      Excl. in GC? --     Constitutional: Alert and oriented. Well appearing and in no acute distress. Eyes: Conjunctivae are normal.  Head: Atraumatic. Nose: No congestion/rhinnorhea. Mouth/Throat: Mucous membranes are moist.   Cardiovascular: Normal rate, regular rhythm.  Heart sounds are normal,  Respiratory: Normal respiratory effort.  No retractions, lungs are clear to auscultation GU: deferred Musculoskeletal: FROM all extremities, warm and well perfused Neurologic:  Normal speech and language.  Skin:  Skin is warm, dry and intact. No rash noted. Psychiatric: Mood and affect are normal. Speech and behavior are normal.  ____________________________________________   LABS (all labs ordered are listed, but only abnormal results are displayed)  Labs Reviewed  INFLUENZA PANEL BY PCR (TYPE A & B)   ____________________________________________   ____________________________________________  RADIOLOGY    ____________________________________________   PROCEDURES  Procedure(s) performed: No  Procedures    ____________________________________________   INITIAL IMPRESSION /  ASSESSMENT AND PLAN / ED COURSE  Pertinent labs & imaging results that were available during my care of the patient were reviewed by me and considered in my medical decision making (see chart for details).  Patient is a 27 year old female complaining of nausea vomiting diarrhea.  She is [redacted] weeks pregnant  On physical exam she appears well.  The exam is benign  Flu test is negative  Patient was given the test results.  Explained to her that the nausea  vomiting diarrhea bug may not be the flu but it is still contagious.  She was encouraged to take the nausea medicine that she has at home.  She should take Imodium if the diarrhea gets worse.  She is to eat a SU[employer] diet.  She is not to return to work until she is been diarrhea free for 24 hours.  She states she understands will comply with our instructions.  A work note was given explaining that she should not return until she has been diarrhea free for 24 hours.  She was discharged in stable condition     As part of my medical decision making, I reviewed the following data within the electronic MEDICAL RECORD NUMBER Nursing notes reviewed and incorporated, Labs reviewed influenza test negative, Notes from prior ED visits and [place] Controlled Substance Database  ____________________________________________   FINAL CLINICAL IMPRESSION(S) / ED DIAGNOSES  Final diagnoses:  Nausea vomiting and diarrhea      NEW MEDICATIONS STARTED DURING THIS VISIT:  Discharge Medication List as of 09/28/2017  3:11 PM       Note:  This document was prepared using Dragon voice recognition software and may include unintentional dictation errors.    Faythe GheeFisher, Earlean Fidalgo W, PA-C 09/28/17 1536    Jene EveryKinner, Robert, MD 09/29/17 [phone]

## 2017-09-28 NOTE — ED Notes (Signed)
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

## 2017-10-07 ENCOUNTER — Ambulatory Visit (INDEPENDENT_AMBULATORY_CARE_PROVIDER_SITE_OTHER): Payer: Medicaid Other | Admitting: Maternal Newborn

## 2017-10-07 ENCOUNTER — Encounter: Payer: Self-pay | Admitting: Maternal Newborn

## 2017-10-07 VITALS — BP 138/84 | Wt 239.0 lb

## 2017-10-07 DIAGNOSIS — N898 Other specified noninflammatory disorders of vagina: Secondary | ICD-10-CM

## 2017-10-07 DIAGNOSIS — Z3400 Encounter for supervision of normal first pregnancy, unspecified trimester: Secondary | ICD-10-CM

## 2017-10-07 LAB — POCT WET PREP (WET MOUNT)
CLUE CELLS WET PREP WHIFF POC: NEGATIVE
Trichomonas Wet Prep HPF POC: ABSENT

## 2017-10-07 NOTE — Progress Notes (Signed)
    Prenatal Problem Visit  Subjective  Alexis Horne is a 27 y.o. G1P0 at 6651w2d being seen today for ongoing prenatal care.  She is currently monitored for the following issues for this low-risk pregnancy and has Supervision of normal first pregnancy, antepartum and BMI 40.0-44.9, adult (HCC) on their problem list.  ----------------------------------------------------------------------------------- Patient reports vaginal discharge.  She has been noticing it for the past two weeks. It is light yellow to white in color without odor. She denies vulvar and vaginal itching and irritation.Marland Kitchen.  ----------------------------------------------------------------------------------- The following portions of the patient's history were reviewed and updated as appropriate: allergies, current medications, past family history, past medical history, past social history, past surgical history and problem list. Problem list updated.   Objective  Blood pressure 138/84, weight 239 lb (108.4 kg), last menstrual period 05/04/2017. Pregravid weight 230 lb (104.3 kg) Total Weight Gain 9 lb (4.082 kg) Urinalysis:      Fetal Status: Fetal Heart Rate (bpm): 144   Movement: Present     General:  Alert, oriented and cooperative. Patient is in no acute distress.  Skin: Skin is warm and dry. No rash noted.   Cardiovascular: Normal heart rate noted  Respiratory: Normal respiratory effort, no problems with respiration noted  Abdomen: Soft, gravid, appropriate for gestational age. Pain/Pressure: Absent     Pelvic:  Cervical exam deferred        Extremities: Normal range of motion.     Mental Status: Normal mood and affect. Normal behavior. Normal judgment and thought content.   Wet prep negative, no ferning, nitrazine negative. Scant physiologic discharge on exam. No vaginal or vulvar irritation apparent.  Assessment   27 y.o. G1P0 at 7051w2d, EDD 03/15/2018 by Ultrasound presenting for a prenatal problem  visit.  Plan   FIRST Problems (from 08/19/17 to present)    Problem Noted Resolved   Supervision of normal first pregnancy, antepartum 08/19/2017 by Oswaldo ConroySchmid, Jacelyn Y, CNM No   Overview Addendum 09/18/2017  4:29 PM by Vena AustriaStaebler, Andreas, MD    [facility]inic [facility] Prenatal Labs  Dating 5 week US Blood type: A/Negative/-- (01/30 0953)   Genetic Screen NIPS: Negative, XY Antibody:Negative (01/30 0953)  Anatomic US  Rubella: <0.90 (01/30 0953) Varicella: Immune  GTT Early:73 Third trimester:  RPR: Non Reactive (01/30 0953)   Rhogam N/A HBsAg: Negative (01/30 0953)   TDaP vaccine                       Flu Shot: 09/18/17 HIV: Non Reactive (01/30 0953)   Baby Food                                GBS:   Contraception  Pap: Needs postpartum  CBB     CS/VBAC    Support Person               Discussed that pregnancy can cause an increase in physiologic discharge and distinguishing characteristics of abnormal discharge.  Preterm labor symptoms and general obstetric precautionswere reviewed with the patient.   Keep scheduled ROB on 10/16/17.  Marcelyn BruinsJacelyn Schmid, CNM 10/07/2017  9:34 AM

## 2017-10-07 NOTE — Progress Notes (Signed)
C/o has been having some leakage for almost two weeks now, yellowish color, no itching, burning or irritation, no odor.rj.

## 2017-10-08 ENCOUNTER — Encounter: Payer: Self-pay | Admitting: Maternal Newborn

## 2017-10-16 ENCOUNTER — Encounter: Payer: Self-pay | Admitting: Advanced Practice Midwife

## 2017-10-16 ENCOUNTER — Ambulatory Visit (INDEPENDENT_AMBULATORY_CARE_PROVIDER_SITE_OTHER): Payer: Medicaid Other

## 2017-10-16 ENCOUNTER — Ambulatory Visit (INDEPENDENT_AMBULATORY_CARE_PROVIDER_SITE_OTHER): Payer: Medicaid Other | Admitting: Advanced Practice Midwife

## 2017-10-16 VITALS — BP 128/88 | Wt 241.0 lb

## 2017-10-16 DIAGNOSIS — Z3A18 18 weeks gestation of pregnancy: Secondary | ICD-10-CM

## 2017-10-16 DIAGNOSIS — Z3400 Encounter for supervision of normal first pregnancy, unspecified trimester: Secondary | ICD-10-CM

## 2017-10-16 DIAGNOSIS — O0990 Supervision of high risk pregnancy, unspecified, unspecified trimester: Secondary | ICD-10-CM

## 2017-10-16 DIAGNOSIS — N760 Acute vaginitis: Secondary | ICD-10-CM

## 2017-10-16 DIAGNOSIS — Z363 Encounter for antenatal screening for malformations: Secondary | ICD-10-CM | POA: Diagnosis not present

## 2017-10-16 DIAGNOSIS — IMO0002 Reserved for concepts with insufficient information to code with codable children: Secondary | ICD-10-CM

## 2017-10-16 DIAGNOSIS — Z0489 Encounter for examination and observation for other specified reasons: Secondary | ICD-10-CM

## 2017-10-16 DIAGNOSIS — B9689 Other specified bacterial agents as the cause of diseases classified elsewhere: Secondary | ICD-10-CM

## 2017-10-16 MED ORDER — METRONIDAZOLE 0.75 % VA GEL: 1.0000 | Freq: Every day | VAGINAL | 0 refills | Status: AC

## 2017-10-16 NOTE — Patient Instructions (Signed)
Morning Sickness °Morning sickness is when you feel sick to your stomach (nauseous) during pregnancy. This nauseous feeling may or may not come with vomiting. It often occurs in the morning but can be a problem any time of day. Morning sickness is most common during the first trimester, but it may continue throughout pregnancy. While morning sickness is unpleasant, it is usually harmless unless you develop severe and continual vomiting (hyperemesis gravidarum). This condition requires more intense treatment. °What are the causes? °The cause of morning sickness is not completely known but seems to be related to normal hormonal changes that occur in pregnancy. °What increases the risk? °You are at greater risk if you: °· Experienced nausea or vomiting before your pregnancy. °· Had morning sickness during a previous pregnancy. °· Are pregnant with more than one baby, such as twins. ° °How is this treated? °Do not use any medicines (prescription, over-the-counter, or herbal) for morning sickness without first talking to your health care provider. Your health care provider may prescribe or recommend: °· Vitamin B6 supplements. °· Anti-nausea medicines. °· The herbal medicine ginger. ° °Follow these instructions at home: °· Only take over-the-counter or prescription medicines as directed by your health care provider. °· Taking multivitamins before getting pregnant can prevent or decrease the severity of morning sickness in most women. °· Eat a piece of dry toast or unsalted crackers before getting out of bed in the morning. °· Eat five or six small meals a day. °· Eat dry and bland foods (rice, baked potato). Foods high in carbohydrates are often helpful. °· Do not drink liquids with your meals. Drink liquids between meals. °· Avoid greasy, fatty, and spicy foods. °· Get someone to cook for you if the smell of any food causes nausea and vomiting. °· If you feel nauseous after taking prenatal vitamins, take the vitamins at  night or with a snack. °· Snack on protein foods (nuts, yogurt, cheese) between meals if you are hungry. °· Eat unsweetened gelatins for desserts. °· Wearing an acupressure wristband (worn for sea sickness) may be helpful. °· Acupuncture may be helpful. °· Do not smoke. °· Get a humidifier to keep the air in your house free of odors. °· Get plenty of fresh air. °Contact a health care provider if: °· Your home remedies are not working, and you need medicine. °· You feel dizzy or lightheaded. °· You are losing weight. °Get help right away if: °· You have persistent and uncontrolled nausea and vomiting. °· You pass out (faint). °This information is not intended to replace advice given to you by your health care provider. Make sure you discuss any questions you have with your health care provider. °Document Released: 08/28/2006 Document Revised: 12/13/2015 Document Reviewed: 12/22/2012 °Elsevier Interactive Patient Education © 2017 [employer] ° °

## 2017-10-16 NOTE — Progress Notes (Signed)
Routine Prenatal Care Visit  Subjective  Alexis Horne is a 27 y.o. G1P0 at 7125w4d being seen today for ongoing prenatal care.  She is currently monitored for the following issues for this high-risk pregnancy and has Supervision of high-risk pregnancy and BMI 40.0-44.9, adult (HCC) on their problem list.  ----------------------------------------------------------------------------------- Patient reports continued nausea and vomiting that is helped by phenergan. Vaginal discharge with a yellow color and sometimes an odor. She had some irritation associated with it but that has improved. She denies urinary symptoms.   Contractions: Not present. Vag. Bleeding: None.  Movement: Present. Denies leaking of fluid.  ----------------------------------------------------------------------------------- The following portions of the patient's history were reviewed and updated as appropriate: allergies, current medications, past family history, past medical history, past social history, past surgical history and problem list. Problem list updated.   Objective  Blood pressure 128/88, weight 241 lb (109.3 kg), last menstrual period 05/04/2017. Pregravid weight 230 lb (104.3 kg) Total Weight Gain 11 lb (4.99 kg) Urinalysis: Urine Protein: Trace Urine Glucose: Negative  Fetal Status: Fetal Heart Rate (bpm): 139   Movement: Present     Anatomy scan today is incomplete for 4 chamber heart and spine and otherwise normal  General:  Alert, oriented and cooperative. Patient is in no acute distress.  Skin: Skin is warm and dry. No rash noted.   Cardiovascular: Normal heart rate noted  Respiratory: Normal respiratory effort, no problems with respiration noted  Abdomen: Soft, gravid, appropriate for gestational age. Pain/Pressure: Absent     Pelvic:  Cervical exam deferred      vaginal swab done Wet Prep is positive for a few clue cells, negative for whiff and yeast  Extremities: Normal range of motion.       Mental Status: Normal mood and affect. Normal behavior. Normal judgment and thought content.   Assessment   27 y.o. G1P0 at 125w4d by  03/15/2018, by Ultrasound presenting for routine prenatal visit, bacterial vaginosis  Plan   FIRST Problems (from 08/19/17 to present)    Problem Noted Resolved   Supervision of high-risk pregnancy 08/19/2017 by Oswaldo ConroySchmid, Jacelyn Y, CNM No   Overview Addendum 09/18/2017  4:29 PM by Vena AustriaStaebler, Andreas, MD    Clinic [facility] Prenatal Labs  Dating 5 week US Blood type: A/Negative/-- (01/30 0953)   Genetic Screen NIPS: Negative, XY Antibody:Negative (01/30 0953)  Anatomic US  Rubella: <0.90 (01/30 0953) Varicella: Immune  GTT Early:73 Third trimester:  RPR: Non Reactive (01/30 0953)   Rhogam N/A HBsAg: Negative (01/30 0953)   TDaP vaccine                       Flu Shot: 09/18/17 HIV: Non Reactive (01/30 0953)   Baby Food                                GBS:   Contraception  Pap: Needs postpartum  CBB     CS/VBAC    Support Person                  Preterm labor symptoms and general obstetric precautions including but not limited to vaginal bleeding, contractions, leaking of fluid and fetal movement were reviewed in detail with the patient. Please refer to After Visit Summary for other counseling recommendations.  Note with weight lifting restrictions given for work related duties  Return in about 1 month (around 11/13/2017) for f/u anatomy and rob.  Tresea Mall, CNM 10/16/2017 8:57 AM

## 2017-10-16 NOTE — Progress Notes (Signed)
ROB  Anatomy Scan/Its a BOY! C/O leaking thin liquid, sometimes it has an odor. Nausea/ vomiting

## 2017-11-04 ENCOUNTER — Observation Stay
Admission: EM | Admit: 2017-11-04 | Discharge: 2017-11-04 | Disposition: A | Discharge status: Home / Self Care | Payer: Medicaid Other | Attending: Obstetrics and Gynecology | Admitting: Obstetrics and Gynecology

## 2017-11-04 DIAGNOSIS — Z3A21 21 weeks gestation of pregnancy: Secondary | ICD-10-CM | POA: Insufficient documentation

## 2017-11-04 DIAGNOSIS — O0990 Supervision of high risk pregnancy, unspecified, unspecified trimester: Secondary | ICD-10-CM

## 2017-11-04 DIAGNOSIS — O368120 Decreased fetal movements, second trimester, not applicable or unspecified: Principal | ICD-10-CM | POA: Insufficient documentation

## 2017-11-04 HISTORY — DX: Unspecified hearing loss, left ear: H91.92

## 2017-11-04 NOTE — Progress Notes (Signed)
Patient reports fetal movement now

## 2017-11-04 NOTE — Discharge Summary (Signed)
See Final Progress Note 11/04/2017.  Marcelyn BruinsJacelyn Schmid, CNM 11/04/2017  12:32 PM

## 2017-11-04 NOTE — Final Progress Note (Signed)
Physician Final Progress Note  Patient ID: Alexis DallyCheyenne E Vonderhaar MRN: [id number] DOB/AGE: 25/08/1990 26 y.o.  Admit date: 11/04/2017 Admitting provider: Vena AustriaAndreas Staebler, MD Discharge date: 11/04/2017  Admission Diagnoses: Decreased fetal movement  Discharge Diagnoses:  Active Problems:   * No active hospital problems. *  History of Present Illness: The patient is a 27 y.o. female G1P0 at 6673w2d who presents for decreased fetal movement. She had last felt fetal movement on 11/03/17 around 2100. She usually feels movements after eating a meal or when she is resting in bed. She drank a caffeinated drink this morning and still did not feel any fetal activity. She has not eaten breakfast. She denies vaginal bleeding, loss of fluid and abdominal pain.  10 point review of systems negative unless otherwise noted in HPI.  Past Medical History:  Diagnosis Date  . Deaf, left     History reviewed. No pertinent surgical history.  No current facility-administered medications on file prior to encounter.    Current Outpatient Medications on File Prior to Encounter  Medication Sig Dispense Refill  . prenatal vitamin w/FE, FA (PRENATAL 1 + 1) 27-1 MG TABS tablet Take 1 tablet by mouth daily at 12 noon.    . promethazine (PHENERGAN) 25 MG tablet Take 25 mg by mouth every 6 (six) hours as needed for nausea or vomiting.      Allergies  Allergen Reactions  . Ibuprofen Swelling  . Kiwi Extract     Swollen and itchy    Social History   Socioeconomic History  . Marital status: Single    Spouse name: Not on file  . Number of children: Not on file  . Years of education: Not on file  . Highest education level: Not on file  Occupational History  . Not on file  Social Needs  . Financial resource strain: Not on file  . Food insecurity:    Worry: Not on file    Inability: Not on file  . Transportation needs:    Medical: Not on file    Non-medical: Not on file  Tobacco Use  . Smoking status:  Never Smoker  . Smokeless tobacco: Never Used  Substance and Sexual Activity  . Alcohol use: No  . Drug use: No  . Sexual activity: Yes  Lifestyle  . Physical activity:    Days per week: Not on file    Minutes per session: Not on file  . Stress: Not on file  Relationships  . Social connections:    Talks on phone: Not on file    Gets together: Not on file    Attends religious service: Not on file    Active member of club or organization: Not on file    Attends meetings of clubs or organizations: Not on file    Relationship status: Not on file  . Intimate partner violence:    Fear of current or ex partner: Not on file    Emotionally abused: Not on file    Physically abused: Not on file    Forced sexual activity: Not on file  Other Topics Concern  . Not on file  Social History Narrative  . Not on file    Physical Exam: LMP 05/04/2017   Gen: NAD Pulm: no increased work of breathing Abd: gravid, soft, non-tender Pelvic: deferred Ext: no signs of DVT on exam  FHT heard via Doppler by RN x 2 and on my exam, ranging from 134-146 bpm. Patient discerned fetal movement during my exam.  Consults: None  Significant Findings/ Diagnostic Studies: N/A  Procedures: None  Discharge Condition: good  Disposition: Discharge disposition: 01-Home or Self Care      Diet: Regular diet  Discharge Activity: Activity as tolerated  Discharge Instructions    Discharge activity:  No Restrictions   Complete by:  As directed    Discharge diet:  No restrictions   Complete by:  As directed    No sexual activity restrictions   Complete by:  As directed    Notify physician for a general feeling that "something is not right"   Complete by:  As directed    Notify physician for increase or change in vaginal discharge   Complete by:  As directed    Notify physician for intestinal cramps, with or without diarrhea, sometimes described as "gas pain"   Complete by:  As directed    Notify  physician for leaking of fluid   Complete by:  As directed    Notify physician for low, dull backache, unrelieved by heat or Tylenol   Complete by:  As directed    Notify physician for menstrual like cramps   Complete by:  As directed    Notify physician for pelvic pressure   Complete by:  As directed    Notify physician for uterine contractions.  These may be painless and feel like the uterus is tightening or the baby is  "balling up"   Complete by:  As directed    Notify physician for vaginal bleeding   Complete by:  As directed    PRETERM LABOR:  Includes any of the follwing symptoms that occur between 20 - [redacted] weeks gestation.  If these symptoms are not stopped, preterm labor can result in preterm delivery, placing your baby at risk   Complete by:  As directed      Allergies as of 11/04/2017      Reactions   Ibuprofen Swelling   Kiwi Extract    Swollen and itchy      Medication List    TAKE these medications   prenatal vitamin w/FE, FA 27-1 MG Tabs tablet Take 1 tablet by mouth daily at 12 noon.   promethazine 25 MG tablet Commonly known as:  PHENERGAN Take 25 mg by mouth every 6 (six) hours as needed for nausea or vomiting.      Keep appointment for ROB. Return to care with further concerns for decrease in fetal activity.  Total time spent taking care of this patient: 10 minutes.  Signed: Oswaldo Conroy, CNM  11/04/2017, 12:22 PM

## 2017-11-04 NOTE — OB Triage Note (Signed)
Pt 3477w2d present d/t decreased fetal movement, last felt baby move "at 9pm last night. He usually moves when I eat and sleep but I haven't felt anything today." FHT 134 via doppler. Pt denies vaginal bleeding, LOF and pain.

## 2017-11-16 ENCOUNTER — Ambulatory Visit (INDEPENDENT_AMBULATORY_CARE_PROVIDER_SITE_OTHER): Payer: Medicaid Other

## 2017-11-16 ENCOUNTER — Ambulatory Visit (INDEPENDENT_AMBULATORY_CARE_PROVIDER_SITE_OTHER): Payer: Medicaid Other | Admitting: Obstetrics and Gynecology

## 2017-11-16 VITALS — BP 120/80 | Wt 250.0 lb

## 2017-11-16 DIAGNOSIS — O99210 Obesity complicating pregnancy, unspecified trimester: Secondary | ICD-10-CM | POA: Insufficient documentation

## 2017-11-16 DIAGNOSIS — O0990 Supervision of high risk pregnancy, unspecified, unspecified trimester: Secondary | ICD-10-CM

## 2017-11-16 DIAGNOSIS — Z0489 Encounter for examination and observation for other specified reasons: Secondary | ICD-10-CM

## 2017-11-16 DIAGNOSIS — Z3A23 23 weeks gestation of pregnancy: Secondary | ICD-10-CM

## 2017-11-16 DIAGNOSIS — IMO0002 Reserved for concepts with insufficient information to code with codable children: Secondary | ICD-10-CM

## 2017-11-16 DIAGNOSIS — O0992 Supervision of high risk pregnancy, unspecified, second trimester: Secondary | ICD-10-CM

## 2017-11-16 DIAGNOSIS — Z6841 Body Mass Index (BMI) 40.0 and over, adult: Secondary | ICD-10-CM

## 2017-11-16 NOTE — Progress Notes (Signed)
Routine Prenatal Care Visit  Subjective  Alexis Horne is a 27 y.o. G1P0 at [redacted]w[redacted]d being seen today for ongoing prenatal care.  She is currently monitored for the following issues for this high-risk pregnancy and has Supervision of high-risk pregnancy; BMI 40.0-44.9, adult (HCC); and Maternal obesity, antepartum on their problem list.  ----------------------------------------------------------------------------------- Patient reports no complaints.   Contractions: Not present. Vag. Bleeding: None.  Movement: Present. Denies leaking of fluid.  ----------------------------------------------------------------------------------- The following portions of the patient's history were reviewed and updated as appropriate: allergies, current medications, past family history, past medical history, past social history, past surgical history and problem list. Problem list updated.   Objective  Blood pressure 120/80, weight 250 lb (113.4 kg), last menstrual period 05/04/2017. Pregravid weight 230 lb (104.3 kg) Total Weight Gain 20 lb (9.072 kg) Urinalysis:      Fetal Status: Fetal Heart Rate (bpm): 145   Movement: Present     General:  Alert, oriented and cooperative. Patient is in no acute distress.  Skin: Skin is warm and dry. No rash noted.   Cardiovascular: Normal heart rate noted  Respiratory: Normal respiratory effort, no problems with respiration noted  Abdomen: Soft, gravid, appropriate for gestational age. Pain/Pressure: Absent     Pelvic:  Cervical exam deferred        Extremities: Normal range of motion.     ental Status: Normal mood and affect. Normal behavior. Normal judgment and thought content.   US Ob Follow Up  Result Date: 11/16/2017 ULTRASOUND REPORT Patient Name: Alexis Horne DOB: 09-23-1990 MRN: [id number] Location: [facility] Date of Service: 11/16/2017 Indications:F/U Anatomy Findings: Mason Jim intrauterine pregnancy is visualized with FHR at 153 BPM. Fetal  presentation is Transverse. Placenta: Anterior, grade 0. AFI: subjectively normal. Anatomic survey is complete. Impression: 1. [redacted]w[redacted]d Viable Singleton Intrauterine pregnancy previously established criteria. 2. Normal Anatomy Scan is now complete Recommendations: 1.Clinical correlation with the patient's History and Physical Exam. Willette Alma, RDMS, RVT There is a singleton gestation with subjectively normal amniotic fluid volume.  Limited evaluation of the fetal anatomy was performed today, focusing on on anatomic structures not fully visualized at the time of prior study.The visualized fetal anatomical survey appears within normal limits within the resolution of ultrasound as described above, and the anatomic survey is now complete with adequate 4-chamber heart view and spine views.  It must be noted that a normal ultrasound is unable to rule out fetal aneuploidy.  Vena Austria, MD, [facility], [facility] 11/16/2017, 3:42 PM     Assessment   26 y.o. G1P0 at [redacted]w[redacted]d by  03/15/2018, by Ultrasound presenting for routine prenatal visit  Plan   FIRST Problems (from 08/19/17 to present)    Problem Noted Resolved   Supervision of high-risk pregnancy 08/19/2017 by Oswaldo Conroy, CNM No   Overview Addendum 09/18/2017  4:29 PM by Vena Austria, MD    Clinic [facility] Prenatal Labs  Dating 5 week [place] Blood type: A/Negative/-- (01/30 0953)   Genetic Screen NIPS: Negative, XY Antibody:Negative (01/30 0953)  Anatomic [place]  Rubella: <0.90 (01/30 0953) Varicella: Immune  GTT Early:73 Third trimester:  RPR: Non Reactive (01/30 0953)   Rhogam N/A HBsAg: Negative (01/30 0953)   TDaP vaccine                       Flu Shot: 09/18/17 HIV: Non Reactive (01/30 0953)   Baby Food  GBS:   Contraception  Pap: Needs postpartum  CBB     CS/VBAC    Support Person                  Gestational age appropriate obstetric precautions including but not limited  to vaginal bleeding, contractions, leaking of fluid and fetal movement were reviewed in detail with the patient.   - headaches short self limited, discussed magnesium as well as tylenol.  If worsening or does not break to present for BP check - Discussed PUPPS and self care options and clinical course  Return in about 1 month (around 12/14/2017) for ROB, 28 week labs, and growth scan.  Vena Austria, MD, [facility], [facility] 11/16/2017, 3:59 PM

## 2017-11-16 NOTE — Progress Notes (Signed)
ROB Patient says stomach and legs are breaking out. , also having severe headaches that last a few mins. U/S today

## 2017-11-23 ENCOUNTER — Other Ambulatory Visit: Payer: Self-pay

## 2017-11-23 ENCOUNTER — Observation Stay
Admission: EM | Admit: 2017-11-23 | Discharge: 2017-11-23 | Disposition: A | Discharge status: Home / Self Care | Payer: Medicaid Other | Attending: Obstetrics and Gynecology | Admitting: Obstetrics and Gynecology

## 2017-11-23 DIAGNOSIS — H9192 Unspecified hearing loss, left ear: Secondary | ICD-10-CM | POA: Insufficient documentation

## 2017-11-23 DIAGNOSIS — O26892 Other specified pregnancy related conditions, second trimester: Secondary | ICD-10-CM | POA: Diagnosis present

## 2017-11-23 DIAGNOSIS — Z79899 Other long term (current) drug therapy: Secondary | ICD-10-CM | POA: Diagnosis not present

## 2017-11-23 DIAGNOSIS — O26899 Other specified pregnancy related conditions, unspecified trimester: Secondary | ICD-10-CM | POA: Diagnosis present

## 2017-11-23 DIAGNOSIS — O0990 Supervision of high risk pregnancy, unspecified, unspecified trimester: Secondary | ICD-10-CM

## 2017-11-23 DIAGNOSIS — O99210 Obesity complicating pregnancy, unspecified trimester: Secondary | ICD-10-CM

## 2017-11-23 DIAGNOSIS — Z3A24 24 weeks gestation of pregnancy: Secondary | ICD-10-CM | POA: Diagnosis not present

## 2017-11-23 DIAGNOSIS — R109 Unspecified abdominal pain: Secondary | ICD-10-CM | POA: Insufficient documentation

## 2017-11-23 LAB — URINALYSIS, COMPLETE (UACMP) WITH MICROSCOPIC
Bilirubin Urine: NEGATIVE
Glucose, UA: NEGATIVE mg/dL
HGB URINE DIPSTICK: NEGATIVE
Ketones, ur: NEGATIVE mg/dL
LEUKOCYTES UA: NEGATIVE
Nitrite: NEGATIVE
PROTEIN: NEGATIVE mg/dL
Specific Gravity, Urine: 1.025 (ref 1.005–1.030)
pH: 6 (ref 5.0–8.0)

## 2017-11-23 LAB — WET PREP, GENITAL
CLUE CELLS WET PREP: NONE SEEN
Sperm: NONE SEEN
TRICH WET PREP: NONE SEEN
YEAST WET PREP: NONE SEEN

## 2017-11-23 MED ORDER — ACETAMINOPHEN 325 MG PO TABS: 650.0000 mg | ORAL_TABLET | ORAL | Status: DC | PRN

## 2017-11-23 NOTE — Discharge Instructions (Signed)
Come back if:  Big gush of fluids Decreased fetal movement Temp over 100.4 Heavy vaginal bleeding Contractions every 3-5 min lasting at least one hour  Get plenty of rest ans stay well hydrated!

## 2017-11-23 NOTE — Discharge Summary (Signed)
See Final Progress Note 11/23/2017.  Marcelyn Bruins, CNM 11/23/2017  9:04 PM

## 2017-11-23 NOTE — OB Triage Note (Signed)
Pt G1P0 [redacted]w[redacted]d complains of back pain and abdominal pain since 1400 this afternoon. Pt states ctx have been q81min apart. Denies LOF and vaginal bleeding. Pt states last intercourse was 2 weeks ago. Pt states she has only drank 1 bottle of water today. VSS. Monitors applied and assessing.

## 2017-11-23 NOTE — Final Progress Note (Addendum)
Physician Final Progress Note  Patient ID: JIOVANNA FREI MRN: [id number] DOB/AGE: 10-27-1990 27 y.o.  Admit date: 11/23/2017 Admitting provider: Vena Austria, MD Discharge date: 11/23/2017   Admission Diagnoses: Abdominal pain during pregnancy  Discharge Diagnoses:  Active Problems:   Abdominal pain during pregnancy   History of Present Illness: The patient is a 27 y.o. female G1P0 at [redacted]w[redacted]d who presents for cramping pains in her abdomen that began around 1400 while she was at work. They were intermittent and about every 20 minutes apart. Rates pain as 8/10. Has not tried any medication or rest to improve them. Sometimes she also feels the pains in her back. She has ongoing vaginal discharge, treated for BV at the end of March. Admits to inadequate hydration. No vaginal bleeding or loss of fluid, no recent intercourse. Baby is moving well.   Review of systems was negative unless otherwise noted in HPI.  Past Medical History:  Diagnosis Date  . Deaf, left     History reviewed. No pertinent surgical history.  No current facility-administered medications on file prior to encounter.    Current Outpatient Medications on File Prior to Encounter  Medication Sig Dispense Refill  . prenatal vitamin w/FE, FA (PRENATAL 1 + 1) 27-1 MG TABS tablet Take 1 tablet by mouth daily at 12 noon.      Allergies  Allergen Reactions  . Ibuprofen Swelling  . Kiwi Extract     Swollen and itchy    Social History   Socioeconomic History  . Marital status: Single    Spouse name: Not on file  . Number of children: Not on file  . Years of education: Not on file  . Highest education level: Not on file  Occupational History  . Not on file  Social Needs  . Financial resource strain: Not on file  . Food insecurity:    Worry: Not on file    Inability: Not on file  . Transportation needs:    Medical: Not on file    Non-medical: Not on file  Tobacco Use  . Smoking status: Never Smoker  .  Smokeless tobacco: Never Used  Substance and Sexual Activity  . Alcohol use: No  . Drug use: No  . Sexual activity: Yes  Lifestyle  . Physical activity:    Days per week: Not on file    Minutes per session: Not on file  . Stress: Not on file  Relationships  . Social connections:    Talks on phone: Not on file    Gets together: Not on file    Attends religious service: Not on file    Active member of club or organization: Not on file    Attends meetings of clubs or organizations: Not on file    Relationship status: Not on file  . Intimate partner violence:    Fear of current or ex partner: Not on file    Emotionally abused: Not on file    Physically abused: Not on file    Forced sexual activity: Not on file  Other Topics Concern  . Not on file  Social History Narrative  . Not on file    Physical Exam: BP 123/79 (BP Location: Right Arm)   Pulse 92   Temp 97.7 F (36.5 C) (Oral)   Resp 16   Ht  (1.6 m)   Wt 250 lb (113.4 kg)   LMP 05/04/2017   BMI 44.29 kg/m   Gen: NAD CV: Regular rate Pulm: No  increased work of breathing Pelvic: SSE: cervix visually closed, no pooling, small amount of white discharge, no blood Ext: no signs of DVT on exam  FHT: 135, occasional uterine irritability on toco  Consults: None  Significant Findings/ Diagnostic Studies: labs: wet prep and urinalysis negative, nitrazine and ferning negative.  Procedures: SSE  Discharge Condition: good  Disposition: Discharge disposition: 01-Home or Self Care       Diet: Regular diet  Discharge Activity: Activity as tolerated  Discharge Instructions    Discharge activity:  No Restrictions   Complete by:  As directed    Discharge diet:  No restrictions   Complete by:  As directed    No sexual activity restrictions   Complete by:  As directed    Notify physician for a general feeling that "something is not right"   Complete by:  As directed    Notify physician for increase or change  in vaginal discharge   Complete by:  As directed    Notify physician for intestinal cramps, with or without diarrhea, sometimes described as "gas pain"   Complete by:  As directed    Notify physician for leaking of fluid   Complete by:  As directed    Notify physician for low, dull backache, unrelieved by heat or Tylenol   Complete by:  As directed    Notify physician for menstrual like cramps   Complete by:  As directed    Notify physician for pelvic pressure   Complete by:  As directed    Notify physician for uterine contractions.  These may be painless and feel like the uterus is tightening or the baby is  "balling up"   Complete by:  As directed    Notify physician for vaginal bleeding   Complete by:  As directed    PRETERM LABOR:  Includes any of the follwing symptoms that occur between 20 - [redacted] weeks gestation.  If these symptoms are not stopped, preterm labor can result in preterm delivery, placing your baby at risk   Complete by:  As directed      Allergies as of 11/23/2017      Reactions   Ibuprofen Swelling   Kiwi Extract    Swollen and itchy      Medication List    TAKE these medications   prenatal vitamin w/FE, FA 27-1 MG Tabs tablet Take 1 tablet by mouth daily at 12 noon.      Follow-up Information    [facility] Follow up.   Why:  Go to next appointment Contact information: [address] [place] [address] [phone]          Pain resolved in triage and no signs of preterm labor, bacterial vaginosis or UTI. Counseled good hydration, rest; Tylenol and brief application of heat for pain relief as needed. Return precautions given for symptoms of preterm labor.  Signed: Oswaldo Conroy, CNM  11/23/2017, 8:54 PM

## 2017-12-11 ENCOUNTER — Telehealth: Payer: Self-pay

## 2017-12-11 NOTE — Telephone Encounter (Signed)
Pt calling wondering if it was okay to get [place]. I advised pt this is fine, just make sure she doesn't have any open wounds and she showers after entering [place], but that it should be fine

## 2017-12-15 ENCOUNTER — Other Ambulatory Visit: Payer: Medicaid Other

## 2017-12-15 ENCOUNTER — Ambulatory Visit (INDEPENDENT_AMBULATORY_CARE_PROVIDER_SITE_OTHER): Payer: Medicaid Other | Admitting: Obstetrics and Gynecology

## 2017-12-15 ENCOUNTER — Ambulatory Visit (INDEPENDENT_AMBULATORY_CARE_PROVIDER_SITE_OTHER): Payer: Medicaid Other

## 2017-12-15 VITALS — BP 128/68 | Wt 257.0 lb

## 2017-12-15 DIAGNOSIS — O0992 Supervision of high risk pregnancy, unspecified, second trimester: Secondary | ICD-10-CM

## 2017-12-15 DIAGNOSIS — O99213 Obesity complicating pregnancy, third trimester: Secondary | ICD-10-CM | POA: Diagnosis not present

## 2017-12-15 DIAGNOSIS — Z3A27 27 weeks gestation of pregnancy: Secondary | ICD-10-CM

## 2017-12-15 DIAGNOSIS — O26893 Other specified pregnancy related conditions, third trimester: Secondary | ICD-10-CM

## 2017-12-15 DIAGNOSIS — O0990 Supervision of high risk pregnancy, unspecified, unspecified trimester: Secondary | ICD-10-CM

## 2017-12-15 DIAGNOSIS — O0993 Supervision of high risk pregnancy, unspecified, third trimester: Secondary | ICD-10-CM | POA: Diagnosis not present

## 2017-12-15 DIAGNOSIS — Z6791 Unspecified blood type, Rh negative: Secondary | ICD-10-CM

## 2017-12-15 DIAGNOSIS — Z6841 Body Mass Index (BMI) 40.0 and over, adult: Secondary | ICD-10-CM

## 2017-12-15 DIAGNOSIS — O99210 Obesity complicating pregnancy, unspecified trimester: Secondary | ICD-10-CM

## 2017-12-15 MED ORDER — RHO D IMMUNE GLOBULIN 1500 UNIT/2ML IJ SOSY
300.0000 ug | PREFILLED_SYRINGE | Freq: Once | INTRAMUSCULAR | Status: AC
  Administered 2017-12-15: 300 ug via INTRAMUSCULAR

## 2017-12-15 NOTE — Addendum Note (Signed)
Addended by: [place], Sultan Pargas B on: 12/15/2017 11:27 AM   Modules accepted: Orders

## 2017-12-15 NOTE — Progress Notes (Signed)
ROB Vaginal pulling and pressure 28 Week labs Rhogam today

## 2017-12-15 NOTE — Progress Notes (Signed)
Routine Prenatal Care Visit  Subjective  Alexis Horne is a 27 y.o. G1P0 at [redacted]w[redacted]d being seen today for ongoing prenatal care.  She is currently monitored for the following issues for this high-risk pregnancy and has Supervision of high-risk pregnancy; BMI 40.0-44.9, adult (HCC); Maternal obesity, antepartum; and Abdominal pain during pregnancy on their problem list.  ----------------------------------------------------------------------------------- Patient reports no complaints.   Contractions: Not present. Vag. Bleeding: None.  Movement: Present. Denies leaking of fluid.  ----------------------------------------------------------------------------------- The following portions of the patient's history were reviewed and updated as appropriate: allergies, current medications, past family history, past medical history, past social history, past surgical history and problem list. Problem list updated.   Objective  Blood pressure 128/68, weight 257 lb (116.6 kg), last menstrual period 05/04/2017. Pregravid weight 230 lb (104.3 kg) Total Weight Gain 27 lb (12.2 kg)  Body mass index is 45.53 kg/m.  Urinalysis: Urine Protein: Negative Urine Glucose: Negative  Fetal Status: Fetal Heart Rate (bpm): 145 Fundal Height: 29 cm Movement: Present     General:  Alert, oriented and cooperative. Patient is in no acute distress.  Skin: Skin is warm and dry. No rash noted.   Cardiovascular: Normal heart rate noted  Respiratory: Normal respiratory effort, no problems with respiration noted  Abdomen: Soft, gravid, appropriate for gestational age. Pain/Pressure: Absent     Pelvic:  Cervical exam deferred        Extremities: Normal range of motion.     ental Status: Normal mood and affect. Normal behavior. Normal judgment and thought content.   US Ob Follow Up  Result Date: 12/15/2017 Patient Name: Alexis Horne DOB: 1990/12/21 MRN: [id number] ULTRASOUND REPORT Location: [facility] Date of  Service: 12/15/2017 Indications:growth/afi Findings: Mason Jim intrauterine pregnancy is visualized with FHR at 141 BPM. Biometrics give an (U/S) Gestational age of [redacted]w[redacted]d and an (U/S) EDD of 03/06/18; this correlates with the clinically established Estimated Date of Delivery: 03/15/18. Fetal presentation is transverse, head on the Rt maternal. Placenta:fundal mostly anterior, grade 1 . AFI: 12.8 cm Growth percentile is 63.3%. EFW: 2lb8oz Impression: 1. [redacted]w[redacted]d Viable Singleton Intrauterine pregnancy previously established criteria. 2. Growth is 63.3 %ile.  AFI is 12.8 cm. Recommendations: 1.Clinical correlation with the patient's History and Physical Exam. Abeer Alsammarraie RDMS There is a singleton gestation with normal amniotic fluid volume. The fetal biometry correlates with established dating.  Limited fetal anatomy was performed.The visualized fetal anatomical survey appears within normal limits within the resolution of ultrasound as described above.  It must be noted that a normal ultrasound is unable to rule out fetal aneuploidy.  Vena Austria, MD, [facility], [facility] 12/15/2017, 10:13 AM   US Ob Follow Up  Result Date: 11/16/2017 ULTRASOUND REPORT Patient Name: Alexis Horne DOB: [birth year] MRN: [id number] Location: [facility] Date of Service: 11/16/2017 Indications:F/U Anatomy Findings: Mason Jim intrauterine pregnancy is visualized with FHR at 153 BPM. Fetal presentation is Transverse. Placenta: Anterior, grade 0. AFI: subjectively normal. Anatomic survey is complete. Impression: 1. [redacted]w[redacted]d Viable Singleton Intrauterine pregnancy previously established criteria. 2. Normal Anatomy Scan is now complete Recommendations: 1.Clinical correlation with the patient's History and Physical Exam. Willette Alma, RDMS, RVT There is a singleton gestation with subjectively normal amniotic fluid volume.  Limited evaluation of the fetal anatomy was performed today, focusing on on  anatomic structures not fully visualized at the time of prior study.The visualized fetal anatomical survey appears within normal limits within the resolution of ultrasound as described above, and the  anatomic survey is now complete with adequate 4-chamber heart view and spine views.  It must be noted that a normal ultrasound is unable to rule out fetal aneuploidy.  Vena Austria, MD, Evern Core [facility], [facility] 11/16/2017, 3:42 PM     Assessment   26 y.o. G1P0 at [redacted]w[redacted]d by  03/15/2018, by Ultrasound presenting for routine prenatal visit  Plan   FIRST Problems (from 08/19/17 to present)    Problem Noted Resolved   Maternal obesity, antepartum 11/16/2017 by Vena Austria, MD No   Supervision of high-risk pregnancy 08/19/2017 by Oswaldo Conroy, CNM No   Overview Addendum 09/18/2017  4:29 PM by Vena Austria, MD    Clinic [facility] Prenatal Labs  Dating 5 week [place] Blood type: A/Negative/-- (01/30 0953)   Genetic Screen NIPS: Negative, XY Antibody:Negative (01/30 0953)  Anatomic [place]  Rubella: <0.90 (01/30 0953) Varicella: Immune  GTT Early:73 Third trimester:  RPR: Non Reactive (01/30 0953)   Rhogam N/A HBsAg: Negative (01/30 0953)   TDaP vaccine                       Flu Shot: 09/18/17 HIV: Non Reactive (01/30 0953)   Baby Food                                GBS:   Contraception  Pap: Needs postpartum  CBB     CS/VBAC    Support Person                  Gestational age appropriate obstetric precautions including but not limited to vaginal bleeding, contractions, leaking of fluid and fetal movement were reviewed in detail with the patient.    Return in about 2 weeks (around 12/29/2017).  Vena Austria, MD, [facility], [facility] 12/15/2017, 10:37 AM

## 2017-12-16 ENCOUNTER — Encounter (INDEPENDENT_AMBULATORY_CARE_PROVIDER_SITE_OTHER): Payer: Self-pay

## 2017-12-16 ENCOUNTER — Telehealth: Payer: Self-pay

## 2017-12-16 LAB — 28 WEEKS RH-PANEL
ANTIBODY SCREEN: NEGATIVE
Basophils Absolute: 0 10*3/uL (ref 0.0–0.2)
Basos: 0 %
EOS (ABSOLUTE): 0.1 10*3/uL (ref 0.0–0.4)
Eos: 1 %
GESTATIONAL DIABETES SCREEN: 119 mg/dL (ref 65–139)
HEMOGLOBIN: 12.8 g/dL (ref 11.1–15.9)
HIV Screen 4th Generation wRfx: NONREACTIVE
Hematocrit: 37.1 % (ref 34.0–46.6)
Immature Grans (Abs): 0 10*3/uL (ref 0.0–0.1)
Immature Granulocytes: 0 %
Lymphocytes Absolute: 1.5 10*3/uL (ref 0.7–3.1)
Lymphs: 14 %
MCH: 32.2 pg (ref 26.6–33.0)
MCHC: 34.5 g/dL (ref 31.5–35.7)
MCV: 93 fL (ref 79–97)
MONOCYTES: 5 %
MONOS ABS: 0.5 10*3/uL (ref 0.1–0.9)
NEUTROS ABS: 8.3 10*3/uL — AB (ref 1.4–7.0)
Neutrophils: 80 %
Platelets: 244 10*3/uL (ref 150–450)
RBC: 3.98 x10E6/uL (ref 3.77–5.28)
RDW: 13.4 % (ref 12.3–15.4)
RPR: NONREACTIVE
WBC: 10.4 10*3/uL (ref 3.4–10.8)

## 2017-12-16 NOTE — Telephone Encounter (Signed)
Pt aware of normal gtt results. Called triage line.

## 2017-12-29 ENCOUNTER — Encounter: Payer: Self-pay | Admitting: Maternal Newborn

## 2017-12-29 ENCOUNTER — Ambulatory Visit (INDEPENDENT_AMBULATORY_CARE_PROVIDER_SITE_OTHER): Payer: Medicaid Other | Admitting: Maternal Newborn

## 2017-12-29 VITALS — BP 110/70 | Wt 257.0 lb

## 2017-12-29 DIAGNOSIS — Z3A29 29 weeks gestation of pregnancy: Secondary | ICD-10-CM

## 2017-12-29 DIAGNOSIS — O9989 Other specified diseases and conditions complicating pregnancy, childbirth and the puerperium: Secondary | ICD-10-CM

## 2017-12-29 DIAGNOSIS — O0990 Supervision of high risk pregnancy, unspecified, unspecified trimester: Secondary | ICD-10-CM

## 2017-12-29 DIAGNOSIS — M549 Dorsalgia, unspecified: Secondary | ICD-10-CM

## 2017-12-29 DIAGNOSIS — O99891 Other specified diseases and conditions complicating pregnancy: Secondary | ICD-10-CM

## 2017-12-29 DIAGNOSIS — O0993 Supervision of high risk pregnancy, unspecified, third trimester: Secondary | ICD-10-CM

## 2017-12-29 NOTE — Patient Instructions (Signed)
Third Trimester of Pregnancy The third trimester is from week 28 through week 40 (months 7 through 9). The third trimester is a time when the unborn baby (fetus) is growing rapidly. At the end of the ninth month, the fetus is about 20 inches in length and weighs 6-10 pounds. Body changes during your third trimester Your body will continue to go through many changes during pregnancy. The changes vary from woman to woman. During the third trimester:  Your weight will continue to increase. You can expect to gain 25-35 pounds (11-16 kg) by the end of the pregnancy.  You may begin to get stretch marks on your hips, abdomen, and breasts.  You may urinate more often because the fetus is moving lower into your pelvis and pressing on your bladder.  You may develop or continue to have heartburn. This is caused by increased hormones that slow down muscles in the digestive tract.  You may develop or continue to have constipation because increased hormones slow digestion and cause the muscles that push waste through your intestines to relax.  You may develop hemorrhoids. These are swollen veins (varicose veins) in the rectum that can itch or be painful.  You may develop swollen, bulging veins (varicose veins) in your legs.  You may have increased body aches in the pelvis, back, or thighs. This is due to weight gain and increased hormones that are relaxing your joints.  You may have changes in your hair. These can include thickening of your hair, rapid growth, and changes in texture. Some women also have hair loss during or after pregnancy, or hair that feels dry or thin. Your hair will most likely return to normal after your baby is born.  Your breasts will continue to grow and they will continue to become tender. A yellow fluid (colostrum) may leak from your breasts. This is the first milk you are producing for your baby.  Your belly button may stick out.  You may notice more swelling in your hands,  face, or ankles.  You may have increased tingling or numbness in your hands, arms, and legs. The skin on your belly may also feel numb.  You may feel short of breath because of your expanding uterus.  You may have more problems sleeping. This can be caused by the size of your belly, increased need to urinate, and an increase in your body's metabolism.  You may notice the fetus "dropping," or moving lower in your abdomen (lightening).  You may have increased vaginal discharge.  You may notice your joints feel loose and you may have pain around your pelvic bone.  What to expect at prenatal visits You will have prenatal exams every 2 weeks until week 36. Then you will have weekly prenatal exams. During a routine prenatal visit:  You will be weighed to make sure you and the baby are growing normally.  Your blood pressure will be taken.  Your abdomen will be measured to track your baby's growth.  The fetal heartbeat will be listened to.  Any test results from the previous visit will be discussed.  You may have a cervical check near your due date to see if your cervix has softened or thinned (effaced).  You will be tested for Group B streptococcus. This happens between 35 and 37 weeks.  Your health care provider may ask you:  What your birth plan is.  How you are feeling.  If you are feeling the baby move.  If you have had   any abnormal symptoms, such as leaking fluid, bleeding, severe headaches, or abdominal cramping.  If you are using any tobacco products, including cigarettes, chewing tobacco, and electronic cigarettes.  If you have any questions.  Other tests or screenings that may be performed during your third trimester include:  Blood tests that check for low iron levels (anemia).  Fetal testing to check the health, activity level, and growth of the fetus. Testing is done if you have certain medical conditions or if there are problems during the  pregnancy.  Nonstress test (NST). This test checks the health of your baby to make sure there are no signs of problems, such as the baby not getting enough oxygen. During this test, a belt is placed around your belly. The baby is made to move, and its heart rate is monitored during movement.  What is false labor? False labor is a condition in which you feel small, irregular tightenings of the muscles in the womb (contractions) that usually go away with rest, changing position, or drinking water. These are called Braxton Hicks contractions. Contractions may last for hours, days, or even weeks before true labor sets in. If contractions come at regular intervals, become more frequent, increase in intensity, or become painful, you should see your health care provider. What are the signs of labor?  Abdominal cramps.  Regular contractions that start at 10 minutes apart and become stronger and more frequent with time.  Contractions that start on the top of the uterus and spread down to the lower abdomen and back.  Increased pelvic pressure and dull back pain.  A watery or bloody mucus discharge that comes from the vagina.  Leaking of amniotic fluid. This is also known as your "water breaking." It could be a slow trickle or a gush. Let your health care provider know if it has a color or strange odor. If you have any of these signs, call your health care provider right away, even if it is before your due date. Follow these instructions at home: Medicines  Follow your health care provider's instructions regarding medicine use. Specific medicines may be either safe or unsafe to take during pregnancy.  Take a prenatal vitamin that contains at least 600 micrograms (mcg) of folic acid.  If you develop constipation, try taking a stool softener if your health care provider approves. Eating and drinking  Eat a balanced diet that includes fresh fruits and vegetables, whole grains, good sources of protein  such as meat, eggs, or tofu, and low-fat dairy. Your health care provider will help you determine the amount of weight gain that is right for you.  Avoid raw meat and uncooked cheese. These carry germs that can cause birth defects in the baby.  If you have low calcium intake from food, talk to your health care provider about whether you should take a daily calcium supplement.  Eat four or five small meals rather than three large meals a day.  Limit foods that are high in fat and processed sugars, such as fried and sweet foods.  To prevent constipation: ? Drink enough fluid to keep your urine clear or pale yellow. ? Eat foods that are high in fiber, such as fresh fruits and vegetables, whole grains, and beans. Activity  Exercise only as directed by your health care provider. Most women can continue their usual exercise routine during pregnancy. Try to exercise for 30 minutes at least 5 days a week. Stop exercising if you experience uterine contractions.  Avoid heavy   lifting.  Do not exercise in extreme heat or humidity, or at high altitudes.  Wear low-heel, comfortable shoes.  Practice good posture.  You may continue to have sex unless your health care provider tells you otherwise. Relieving pain and discomfort  Take frequent breaks and rest with your legs elevated if you have leg cramps or low back pain.  Take warm sitz baths to soothe any pain or discomfort caused by hemorrhoids. Use hemorrhoid cream if your health care provider approves.  Wear a good support bra to prevent discomfort from breast tenderness.  If you develop varicose veins: ? Wear support pantyhose or compression stockings as told by your healthcare provider. ? Elevate your feet for 15 minutes, 3-4 times a day. Prenatal care  Write down your questions. Take them to your prenatal visits.  Keep all your prenatal visits as told by your health care provider. This is important. Safety  Wear your seat belt at  all times when driving.  Make a list of emergency phone numbers, including numbers for family, friends, the hospital, and police and fire departments. General instructions  Avoid cat litter boxes and soil used by cats. These carry germs that can cause birth defects in the baby. If you have a cat, ask someone to clean the litter box for you.  Do not travel far distances unless it is absolutely necessary and only with the approval of your health care provider.  Do not use hot tubs, steam rooms, or saunas.  Do not drink alcohol.  Do not use any products that contain nicotine or tobacco, such as cigarettes and e-cigarettes. If you need help quitting, ask your health care provider.  Do not use any medicinal herbs or unprescribed drugs. These chemicals affect the formation and growth of the baby.  Do not douche or use tampons or scented sanitary pads.  Do not cross your legs for long periods of time.  To prepare for the arrival of your baby: ? Take prenatal classes to understand, practice, and ask questions about labor and delivery. ? Make a trial run to the hospital. ? Visit the hospital and tour the maternity area. ? Arrange for maternity or paternity leave through employers. ? Arrange for family and friends to take care of pets while you are in the hospital. ? Purchase a rear-facing car seat and make sure you know how to install it in your car. ? Pack your hospital bag. ? Prepare the baby's nursery. Make sure to remove all pillows and stuffed animals from the baby's crib to prevent suffocation.  Visit your dentist if you have not gone during your pregnancy. Use a soft toothbrush to brush your teeth and be gentle when you floss. Contact a health care provider if:  You are unsure if you are in labor or if your water has broken.  You become dizzy.  You have mild pelvic cramps, pelvic pressure, or nagging pain in your abdominal area.  You have lower back pain.  You have persistent  nausea, vomiting, or diarrhea.  You have an unusual or bad smelling vaginal discharge.  You have pain when you urinate. Get help right away if:  Your water breaks before 37 weeks.  You have regular contractions less than 5 minutes apart before 37 weeks.  You have a fever.  You are leaking fluid from your vagina.  You have spotting or bleeding from your vagina.  You have severe abdominal pain or cramping.  You have rapid weight loss or weight gain.    You have shortness of breath with chest pain.  You notice sudden or extreme swelling of your face, hands, ankles, feet, or legs.  Your baby makes fewer than 10 movements in 2 hours.  You have severe headaches that do not go away when you take medicine.  You have vision changes. Summary  The third trimester is from week 28 through week 40, months 7 through 9. The third trimester is a time when the unborn baby (fetus) is growing rapidly.  During the third trimester, your discomfort may increase as you and your baby continue to gain weight. You may have abdominal, leg, and back pain, sleeping problems, and an increased need to urinate.  During the third trimester your breasts will keep growing and they will continue to become tender. A yellow fluid (colostrum) may leak from your breasts. This is the first milk you are producing for your baby.  False labor is a condition in which you feel small, irregular tightenings of the muscles in the womb (contractions) that eventually go away. These are called Braxton Hicks contractions. Contractions may last for hours, days, or even weeks before true labor sets in.  Signs of labor can include: abdominal cramps; regular contractions that start at 10 minutes apart and become stronger and more frequent with time; watery or bloody mucus discharge that comes from the vagina; increased pelvic pressure and dull back pain; and leaking of amniotic fluid. This information is not intended to replace advice  given to you by your health care provider. Make sure you discuss any questions you have with your health care provider. Document Released: 07/01/2001 Document Revised: 12/13/2015 Document Reviewed: 09/07/2012 Elsevier Interactive Patient Education  2017 [employer]  

## 2017-12-29 NOTE — Progress Notes (Signed)
    Routine Prenatal Care Visit  Subjective  Alexis Horne is a 27 y.o. G1P0 at 1726w1d being seen today for ongoing prenatal care.  She is currently monitored for the following issues for this high-risk pregnancy and has Supervision of high-risk pregnancy; BMI 40.0-44.9, adult (HCC); Maternal obesity, antepartum; and Abdominal pain during pregnancy on their problem list.  ----------------------------------------------------------------------------------- Patient reports bilateral lower backache. Contractions: Irregular. Vag. Bleeding: None.  Movement: Present. No leaking of fluid.  ----------------------------------------------------------------------------------- The following portions of the patient's history were reviewed and updated as appropriate: allergies, current medications, past family history, past medical history, past social history, past surgical history and problem list. Problem list updated.   Objective   Vitals:   12/29/17 1423  BP: 110/70   Pregravid weight 230 lb (104.3 kg) Total Weight Gain 27 lb (12.2 kg) Urinalysis: Urine Protein: Negative Urine Glucose: Negative  Fetal Status: Fetal Heart Rate (bpm): 138 Fundal Height: 31 cm Movement: Present     General:  Alert, oriented and cooperative. Patient is in no acute distress.  Skin: Skin is warm and dry. No rash noted.   Cardiovascular: Normal heart rate noted  Respiratory: Normal respiratory effort, no problems with respiration noted  Abdomen: Soft, gravid, appropriate for gestational age. Pain/Pressure: Absent     Pelvic:  Cervical exam deferred        Extremities: Normal range of motion.  Edema: None  Mental Status: Normal mood and affect. Normal behavior. Normal judgment and thought content.   Tenderness on the left side of back. Urine culture ordered but could only void a very small amount after drinking water, insufficient for lab. May return to give another sample if symptomatic.  Assessment   27 y.o.  G1P0 at 3526w1d, EDD 03/15/2018 by Ultrasound presenting for routine prenatal visit.  Plan   FIRST Problems (from 08/19/17 to present)    Problem Noted Resolved   Maternal obesity, antepartum 11/16/2017 by Vena AustriaStaebler, Andreas, MD No   Supervision of high-risk pregnancy 08/19/2017 by Oswaldo ConroySchmid, Effie Wahlert Y, CNM No   Overview Addendum 12/16/2017  1:31 PM by Vena AustriaStaebler, Andreas, MD    Clinic [facility] Prenatal Labs  Dating 5 week US Blood type: A/Negative/-- (01/30 0953)   Genetic Screen NIPS: Negative, XY Antibody:Negative (01/30 0953)  Anatomic US  Rubella: <0.90 (01/30 0953) Varicella: Immune  GTT Early:73 Third trimester: 119 RPR: Non Reactive (01/30 0953)   Rhogam N/A HBsAg: Negative (01/30 0953)   TDaP vaccine                       Flu Shot: 09/18/17 HIV: Non Reactive (01/30 0953)   Baby Food                                GBS:   Contraception  Pap: Needs postpartum  CBB     CS/VBAC    Support Person                  Preterm labor symptoms and general obstetric precautions including but not limited to vaginal bleeding, contractions, leaking of fluid and fetal movement were reviewed.  Please refer to After Visit Summary for other counseling recommendations.   Return in about 2 weeks (around 01/12/2018) for ROB.  Marcelyn BruinsJacelyn Dimitriy Carreras, CNM 12/29/2017  4:14 PM

## 2017-12-29 NOTE — Progress Notes (Signed)
C/o low mid back ache.rj

## 2018-01-02 ENCOUNTER — Observation Stay
Admission: EM | Admit: 2018-01-02 | Discharge: 2018-01-03 | Disposition: A | Discharge status: Home / Self Care | Payer: Medicaid Other | Attending: Obstetrics & Gynecology | Admitting: Obstetrics & Gynecology

## 2018-01-02 DIAGNOSIS — Z3A29 29 weeks gestation of pregnancy: Secondary | ICD-10-CM | POA: Insufficient documentation

## 2018-01-02 DIAGNOSIS — O99210 Obesity complicating pregnancy, unspecified trimester: Secondary | ICD-10-CM

## 2018-01-02 DIAGNOSIS — O0990 Supervision of high risk pregnancy, unspecified, unspecified trimester: Secondary | ICD-10-CM

## 2018-01-02 DIAGNOSIS — O26893 Other specified pregnancy related conditions, third trimester: Principal | ICD-10-CM | POA: Insufficient documentation

## 2018-01-02 DIAGNOSIS — H9192 Unspecified hearing loss, left ear: Secondary | ICD-10-CM | POA: Insufficient documentation

## 2018-01-02 DIAGNOSIS — R102 Pelvic and perineal pain: Secondary | ICD-10-CM | POA: Insufficient documentation

## 2018-01-03 DIAGNOSIS — M545 Low back pain: Secondary | ICD-10-CM

## 2018-01-03 DIAGNOSIS — O26893 Other specified pregnancy related conditions, third trimester: Secondary | ICD-10-CM | POA: Diagnosis not present

## 2018-01-03 DIAGNOSIS — R102 Pelvic and perineal pain: Secondary | ICD-10-CM | POA: Diagnosis not present

## 2018-01-03 DIAGNOSIS — H9192 Unspecified hearing loss, left ear: Secondary | ICD-10-CM | POA: Diagnosis not present

## 2018-01-03 DIAGNOSIS — Z3A29 29 weeks gestation of pregnancy: Secondary | ICD-10-CM | POA: Diagnosis not present

## 2018-01-03 NOTE — Discharge Summary (Signed)
Physician Final Progress Note  Patient ID: Alexis Horne MRN: [id number] DOB/AGE: [birth year] 27 y.o.  Admit date: 01/02/2018 Admitting provider: Nadara Mustard, MD Discharge date: 01/03/2018   Admission Diagnoses: back pain, lower abdominal pain and pressure  Discharge Diagnoses:  Active Problems:   Indication for care in labor and delivery, antepartum 27 yo G1 P0 at 29 weeks 6 days with reactive NST, not in labor, back pain during pregnancy, round ligament pain  History of Present Illness: The patient is a 27 y.o. female G1P0 at [redacted]w[redacted]d who presents for constant pain in her lower back that started at 69 PM tonight. At about 11:40 PM she began to feel some lower right sided and central low abdominal pain every 5 to 10 minutes. She denies s/s of UTI. She admits staying hydrated. She denies leakage of fluid or vaginal bleeding. She admits fetal movement. She has not taken any medicine for pain. Discussion of discomforts of third trimester, round ligament pain, and comfort measures including abdominal support binder, heat, tylenol and add benadryl if she is having trouble sleeping.   Past Medical History:  Diagnosis Date  . Deaf, left     No past surgical history on file.  No current facility-administered medications on file prior to encounter.    Current Outpatient Medications on File Prior to Encounter  Medication Sig Dispense Refill  . prenatal vitamin w/FE, FA (PRENATAL 1 + 1) 27-1 MG TABS tablet Take 1 tablet by mouth daily at 12 noon.      Allergies  Allergen Reactions  . Ibuprofen Swelling  . Kiwi Extract     Swollen and itchy    Social History   Socioeconomic History  . Marital status: Single    Spouse name: Not on file  . Number of children: Not on file  . Years of education: Not on file  . Highest education level: Not on file  Occupational History  . Not on file  Social Needs  . Financial resource strain: Not on file  . Food insecurity:    Worry: Not on  file    Inability: Not on file  . Transportation needs:    Medical: Not on file    Non-medical: Not on file  Tobacco Use  . Smoking status: Never Smoker  . Smokeless tobacco: Never Used  Substance and Sexual Activity  . Alcohol use: No  . Drug use: No  . Sexual activity: Yes    Comment: Not in the last 24 hours  Lifestyle  . Physical activity:    Days per week: Not on file    Minutes per session: Not on file  . Stress: Not on file  Relationships  . Social connections:    Talks on phone: Not on file    Gets together: Not on file    Attends religious service: Not on file    Active member of club or organization: Not on file    Attends meetings of clubs or organizations: Not on file    Relationship status: Not on file  . Intimate partner violence:    Fear of current or ex partner: Not on file    Emotionally abused: Not on file    Physically abused: Not on file    Forced sexual activity: Not on file  Other Topics Concern  . Not on file  Social History Narrative  . Not on file    Physical Exam: BP 123/81   Pulse 92   Temp 97.7 F (36.5 C) (  Oral)   Resp 18   LMP 05/04/2017   Gen: NAD CV: RRR Pulm: CTAB Pelvic: attempted cervical exam- cervix is posterior and patient did not tolerate exam well Toco: negative for contractions Fetal well being: 125 bpm, moderate variability, +accelerations, -decelerations Ext: no evidence of DVT  Consults: None  Significant Findings/ Diagnostic Studies: none  Procedures: NST  Discharge Condition: good  Disposition: Discharge disposition: 01-Home or Self Care       Diet: Regular diet  Discharge Activity: Activity as tolerated  Discharge Instructions    Discharge activity:  No Restrictions   Complete by:  As directed    Discharge diet:  No restrictions   Complete by:  As directed    Fetal Kick Count:  Lie on our left side for one hour after a meal, and count the number of times your baby kicks.  If it is less than 5  times, get up, move around and drink some juice.  Repeat the test 30 minutes later.  If it is still less than 5 kicks in an hour, notify your doctor.   Complete by:  As directed    No sexual activity restrictions   Complete by:  As directed    Notify physician for a general feeling that "something is not right"   Complete by:  As directed    Notify physician for increase or change in vaginal discharge   Complete by:  As directed    Notify physician for intestinal cramps, with or without diarrhea, sometimes described as "gas pain"   Complete by:  As directed    Notify physician for leaking of fluid   Complete by:  As directed    Notify physician for low, dull backache, unrelieved by heat or Tylenol   Complete by:  As directed    Notify physician for menstrual like cramps   Complete by:  As directed    Notify physician for pelvic pressure   Complete by:  As directed    Notify physician for uterine contractions.  These may be painless and feel like the uterus is tightening or the baby is  "balling up"   Complete by:  As directed    Notify physician for vaginal bleeding   Complete by:  As directed    PRETERM LABOR:  Includes any of the follwing symptoms that occur between 20 - [redacted] weeks gestation.  If these symptoms are not stopped, preterm labor can result in preterm delivery, placing your baby at risk   Complete by:  As directed      Allergies as of 01/03/2018      Reactions   Ibuprofen Swelling   Kiwi Extract    Swollen and itchy      Medication List    TAKE these medications   prenatal vitamin w/FE, FA 27-1 MG Tabs tablet Take 1 tablet by mouth daily at 12 noon.      Follow-up Information    [facility] Go to.   Specialty:  Obstetrics and Gynecology Why:  regular scheduled prenatal appointment Contact information: [address] [place] [phone]          Total time spent taking care of this patient: 15  minutes  Signed: Tresea MallJane Shaday Rayborn, CNM  01/03/2018, 12:57 AM

## 2018-01-03 NOTE — OB Triage Note (Signed)
Chief Complaint: Lower back pain and abdominal pain  Patient came in complaining of constant back pain that started around 2300 while patient was watching a movie. Around 2340 patient reported lower abdominal pain around every 5-10 mins part. Rating lower back pain a 8 out of 10. Abdominal pain a 6 out of 10. Denies sexual intercourse in the last 24 hours. Denies LOF. Denies vaginal bleeding. Reports positive fetal movement. EFM applied and assessing. Vital signs WNL.

## 2018-01-12 ENCOUNTER — Ambulatory Visit (INDEPENDENT_AMBULATORY_CARE_PROVIDER_SITE_OTHER): Payer: Medicaid Other | Admitting: Obstetrics and Gynecology

## 2018-01-12 VITALS — BP 104/72 | Wt 260.0 lb

## 2018-01-12 DIAGNOSIS — Z23 Encounter for immunization: Secondary | ICD-10-CM

## 2018-01-12 DIAGNOSIS — N930 Postcoital and contact bleeding: Secondary | ICD-10-CM

## 2018-01-12 DIAGNOSIS — O9989 Other specified diseases and conditions complicating pregnancy, childbirth and the puerperium: Secondary | ICD-10-CM

## 2018-01-12 DIAGNOSIS — R109 Unspecified abdominal pain: Secondary | ICD-10-CM

## 2018-01-12 DIAGNOSIS — Z6841 Body Mass Index (BMI) 40.0 and over, adult: Secondary | ICD-10-CM

## 2018-01-12 DIAGNOSIS — O99210 Obesity complicating pregnancy, unspecified trimester: Secondary | ICD-10-CM

## 2018-01-12 DIAGNOSIS — Z113 Encounter for screening for infections with a predominantly sexual mode of transmission: Secondary | ICD-10-CM

## 2018-01-12 DIAGNOSIS — O0990 Supervision of high risk pregnancy, unspecified, unspecified trimester: Secondary | ICD-10-CM

## 2018-01-12 DIAGNOSIS — Z3A31 31 weeks gestation of pregnancy: Secondary | ICD-10-CM

## 2018-01-12 DIAGNOSIS — O99213 Obesity complicating pregnancy, third trimester: Secondary | ICD-10-CM

## 2018-01-12 NOTE — Progress Notes (Signed)
    Routine Prenatal Care Visit  Subjective  Alexis Horne is a 27 y.o. G1P0 at 6968w1d being seen today for ongoing prenatal care.  She is currently monitored for the following issues for this high-risk pregnancy and has Supervision of high-risk pregnancy; BMI 40.0-44.9, adult (HCC); Maternal obesity, antepartum; and Abdominal pain during pregnancy on their problem list.  ----------------------------------------------------------------------------------- Patient reports no complaints.   Contractions: Irregular. Vag. Bleeding: None.  Movement: Present. Denies leaking of fluid.  ----------------------------------------------------------------------------------- The following portions of the patient's history were reviewed and updated as appropriate: allergies, current medications, past family history, past medical history, past social history, past surgical history and problem list. Problem list updated.   Objective  Blood pressure 104/72, weight 260 lb (117.9 kg), last menstrual period 05/04/2017. Pregravid weight 230 lb (104.3 kg) Total Weight Gain 30 lb (13.6 kg)  Body mass index is 46.06 kg/m.  Urinalysis: Urine Protein: Negative Urine Glucose: Negative  Fetal Status: Fetal Heart Rate (bpm): 140 Fundal Height: 33 cm Movement: Present     General:  Alert, oriented and cooperative. Patient is in no acute distress.  Skin: Skin is warm and dry. No rash noted.   Cardiovascular: Normal heart rate noted  Respiratory: Normal respiratory effort, no problems with respiration noted  Abdomen: Soft, gravid, appropriate for gestational age. Pain/Pressure: Absent     Pelvic:  Cervical exam deferred      cervix visually closed, no bleeding noted  Extremities: Normal range of motion.     ental Status: Normal mood and affect. Normal behavior. Normal judgment and thought content.   Wet Prep: PH: 4.5 Clue Cells: Negative Fungal elements: Negative Trichomonas: Negative   Assessment   27  y.o. G1P0 at 10068w1d by  03/15/2018, by Ultrasound presenting for routine prenatal visit  Plan   FIRST Problems (from 08/19/17 to present)    Problem Noted Resolved   Maternal obesity, antepartum 11/16/2017 by Vena AustriaStaebler, Taequan Stockhausen, MD No   Supervision of high-risk pregnancy 08/19/2017 by Oswaldo ConroySchmid, Jacelyn Y, CNM No   Overview Addendum 01/12/2018 10:58 AM by Vena AustriaStaebler, Saachi Zale, MD    [facility] Prenatal Labs  Dating 5 week US Blood type: A/Negative/-- (01/30 0953)   Genetic Screen NIPS: Negative, XY Antibody:Negative (01/30 0953)  Anatomic US Normal female Rubella: <0.90 (01/30 0953) Varicella: Immune  GTT Early:73 Third trimester: 119 RPR: Non Reactive (01/30 0953)   Rhogam N/A HBsAg: Negative (01/30 0953)   TDaP vaccine 06/25/19Flu Shot: 09/18/17 HIV: Non Reactive (01/30 0953)   Baby Food                                GBS:   Contraception  Pap: Needs postpartum  Support Person                  Gestational age appropriate obstetric precautions including but not limited to vaginal bleeding, contractions, leaking of fluid and fetal movement were reviewed in detail with the patient.   - aptima collected - discussed discomfort in pregnancy and supportive measures - growth scan ordered for 32 weeks  Return in about 1 week (around 01/19/2018) for ROB and growth scan.  Vena AustriaAndreas Eliab Closson, MD, Evern CoreFACOG [facility], [facility] 01/12/2018, 11:13 AM

## 2018-01-12 NOTE — Progress Notes (Signed)
ROB  Hip pain/pain in center of stomach TDAP today

## 2018-01-14 LAB — CHLAMYDIA/GONOCOCCUS/TRICHOMONAS, NAA
Chlamydia by NAA: NEGATIVE
Gonococcus by NAA: NEGATIVE
TRICH VAG BY NAA: NEGATIVE

## 2018-01-19 ENCOUNTER — Ambulatory Visit (INDEPENDENT_AMBULATORY_CARE_PROVIDER_SITE_OTHER): Payer: Medicaid Other | Admitting: Obstetrics and Gynecology

## 2018-01-19 ENCOUNTER — Ambulatory Visit (INDEPENDENT_AMBULATORY_CARE_PROVIDER_SITE_OTHER): Payer: Medicaid Other

## 2018-01-19 ENCOUNTER — Encounter: Payer: Self-pay | Admitting: Obstetrics and Gynecology

## 2018-01-19 VITALS — BP 132/90 | Wt 259.0 lb

## 2018-01-19 DIAGNOSIS — O99210 Obesity complicating pregnancy, unspecified trimester: Secondary | ICD-10-CM

## 2018-01-19 DIAGNOSIS — Z3A32 32 weeks gestation of pregnancy: Secondary | ICD-10-CM

## 2018-01-19 DIAGNOSIS — O99213 Obesity complicating pregnancy, third trimester: Secondary | ICD-10-CM | POA: Diagnosis not present

## 2018-01-19 DIAGNOSIS — O0990 Supervision of high risk pregnancy, unspecified, unspecified trimester: Secondary | ICD-10-CM

## 2018-01-19 DIAGNOSIS — Z3A31 31 weeks gestation of pregnancy: Secondary | ICD-10-CM

## 2018-01-19 DIAGNOSIS — Z6841 Body Mass Index (BMI) 40.0 and over, adult: Secondary | ICD-10-CM

## 2018-01-19 DIAGNOSIS — O0993 Supervision of high risk pregnancy, unspecified, third trimester: Secondary | ICD-10-CM

## 2018-01-19 NOTE — Progress Notes (Signed)
Growth scan/ROB C/o of round ligament and back pain, headaches, problems with sleeping/uncomfortable

## 2018-01-19 NOTE — Progress Notes (Signed)
    Routine Prenatal Care Visit  Subjective  Alexis Horne is a 27 y.o. G1P0 at 4671w1d being seen today for ongoing prenatal care.  She is currently monitored for the following issues for this high-risk pregnancy and has Supervision of high-risk pregnancy; BMI 45.0-49.9, adult (HCC); Maternal obesity, antepartum; and Abdominal pain during pregnancy on their problem list.  ----------------------------------------------------------------------------------- Patient reports headache.  Headache is relieved by tylenol and rest.  Contractions: Not present. Vag. Bleeding: None.  Movement: Present. Denies leaking of fluid.  ----------------------------------------------------------------------------------- The following portions of the patient's history were reviewed and updated as appropriate: allergies, current medications, past family history, past medical history, past social history, past surgical history and problem list. Problem list updated.   Objective  Blood pressure 132/90, weight 259 lb (117.5 kg), last menstrual period 05/04/2017. Pregravid weight 230 lb (104.3 kg) Total Weight Gain 29 lb (13.2 kg) Urinalysis: Urine Protein: Trace Urine Glucose: Negative  Fetal Status: Fetal Heart Rate (bpm): 141 Fundal Height: 35 cm Movement: Present  Presentation: Vertex  General:  Alert, oriented and cooperative. Patient is in no acute distress.  Skin: Skin is warm and dry. No rash noted.   Cardiovascular: Normal heart rate noted  Respiratory: Normal respiratory effort, no problems with respiration noted  Abdomen: Soft, gravid, appropriate for gestational age. Pain/Pressure: Absent     Pelvic:  Cervical exam deferred        Extremities: Normal range of motion.     ental Status: Normal mood and affect. Normal behavior. Normal judgment and thought content.     Assessment   27 y.o. G1P0 at 1471w1d by  03/15/2018, by Ultrasound presenting for routine prenatal visit  Plan   FIRST Problems  (from 08/19/17 to present)    Problem Noted Resolved   Maternal obesity, antepartum 11/16/2017 by Vena AustriaStaebler, Andreas, MD No   Supervision of high-risk pregnancy 08/19/2017 by Oswaldo ConroySchmid, Jacelyn Y, CNM No   Overview Addendum 01/19/2018  7:05 PM by Natale MilchSchuman, Charvi Gammage R, MD    [facility] Prenatal Labs  Dating 5 week US Blood type: A/Negative/-- (01/30 0953)   Genetic Screen NIPS: Negative, XY Antibody:Negative (01/30 0953)  Anatomic US Normal female Rubella: <0.90 (01/30 0953) Varicella: Immune  GTT Early:73 Third trimester: 119 RPR: Non Reactive (01/30 0953)   Rhogam 12/15/17 HBsAg: Negative (01/30 0953)   TDaP vaccine 06/25/19Flu Shot: 09/18/17 HIV: Non Reactive (01/30 0953)   Baby Food  Breast                              GBS:   Contraception  IUD Pap: Needs postpartum  Support Person              BMI 45.0-49.9, adult (HCC) 08/19/2017 by Oswaldo ConroySchmid, Jacelyn Y, CNM No       Gestational age appropriate obstetric precautions including but not limited to vaginal bleeding, contractions, leaking of fluid and fetal movement were reviewed in detail with the patient.    Given information on volunteer doula program at the hospital.  Discussed breast feeding. Patient is planning on breast feeding. Patient was given resources and lactation consultation was advised.  Discussed warning signs of preeclampsia.   Return in about 1 week (around 01/26/2018) for ROB and BP check.  Adelene Idlerhristanna Jerol Rufener MD [facility], [facility] 01/19/18 7:07 PM

## 2018-01-29 ENCOUNTER — Ambulatory Visit (INDEPENDENT_AMBULATORY_CARE_PROVIDER_SITE_OTHER): Payer: Medicaid Other | Admitting: Obstetrics and Gynecology

## 2018-01-29 VITALS — BP 116/70 | Wt 262.0 lb

## 2018-01-29 DIAGNOSIS — O99210 Obesity complicating pregnancy, unspecified trimester: Secondary | ICD-10-CM

## 2018-01-29 DIAGNOSIS — Z3A33 33 weeks gestation of pregnancy: Secondary | ICD-10-CM

## 2018-01-29 DIAGNOSIS — O0993 Supervision of high risk pregnancy, unspecified, third trimester: Secondary | ICD-10-CM

## 2018-01-29 NOTE — Progress Notes (Signed)
ROB

## 2018-01-29 NOTE — Progress Notes (Signed)
    Routine Prenatal Care Visit  Subjective  Alexis Horne is a 27 y.o. G1P0 at 9157w4d being seen today for ongoing prenatal care.  She is currently monitored for the following issues for this high-risk pregnancy and has Supervision of high-risk pregnancy; BMI 45.0-49.9, adult (HCC); Maternal obesity, antepartum; and Abdominal pain during pregnancy on their problem list.  ----------------------------------------------------------------------------------- Patient reports no complaints.   Contractions: Irregular. Vag. Bleeding: None.  Movement: Present. Denies leaking of fluid.  ----------------------------------------------------------------------------------- The following portions of the patient's history were reviewed and updated as appropriate: allergies, current medications, past family history, past medical history, past social history, past surgical history and problem list. Problem list updated.   Objective  Blood pressure 116/70, weight 262 lb (118.8 kg), last menstrual period 05/04/2017. Pregravid weight 230 lb (104.3 kg) Total Weight Gain 32 lb (14.5 kg)  Body mass index is 46.41 kg/m.  Urinalysis:      Fetal Status: Fetal Heart Rate (bpm): 130 Fundal Height: 34 cm Movement: Present  Presentation: Vertex  General:  Alert, oriented and cooperative. Patient is in no acute distress.  Skin: Skin is warm and dry. No rash noted.   Cardiovascular: Normal heart rate noted  Respiratory: Normal respiratory effort, no problems with respiration noted  Abdomen: Soft, gravid, appropriate for gestational age. Pain/Pressure: Absent     Pelvic:  Cervical exam deferred        Extremities: Normal range of motion.     ental Status: Normal mood and affect. Normal behavior. Normal judgment and thought content.     Assessment   27 y.o. G1P0 at 4057w4d by  03/15/2018, by Ultrasound presenting for routine prenatal visit  Plan   FIRST Problems (from 08/19/17 to present)    Problem Noted  Resolved   Maternal obesity, antepartum 11/16/2017 by Vena AustriaStaebler, Seham Gardenhire, MD No   Supervision of high-risk pregnancy 08/19/2017 by Oswaldo ConroySchmid, Jacelyn Y, CNM No   Overview Addendum 01/19/2018  7:05 PM by Natale MilchSchuman, Christanna R, MD    [facility] Prenatal Labs  Dating 5 week US Blood type: A/Negative/-- (01/30 0953)   Genetic Screen NIPS: Negative, XY Antibody:Negative (01/30 0953)  Anatomic US Normal female Rubella: <0.90 (01/30 0953) Varicella: Immune  GTT Early:73 Third trimester: 119 RPR: Non Reactive (01/30 0953)   Rhogam 12/15/17 HBsAg: Negative (01/30 0953)   TDaP vaccine 06/25/19Flu Shot: 09/18/17 HIV: Non Reactive (01/30 0953)   Baby Food  Breast                              GBS:   Contraception  IUD Pap: Needs postpartum  Support Person              BMI 45.0-49.9, adult (HCC) 08/19/2017 by Oswaldo ConroySchmid, Jacelyn Y, CNM No       Gestational age appropriate obstetric precautions including but not limited to vaginal bleeding, contractions, leaking of fluid and fetal movement were reviewed in detail with the patient.   - growth scan next visit 2 weeks - start NST/AFI in 3 week (36 weeks)  Return in about 2 weeks (around 02/12/2018) for ROB and growth scan.  Vena AustriaAndreas Glennon Kopko, MD, Evern CoreFACOG [facility], [facility] 01/29/2018, 3:35 PM

## 2018-02-02 ENCOUNTER — Encounter: Payer: Self-pay | Admitting: Obstetrics and Gynecology

## 2018-02-02 ENCOUNTER — Other Ambulatory Visit: Payer: Self-pay

## 2018-02-02 ENCOUNTER — Observation Stay
Admission: EM | Admit: 2018-02-02 | Discharge: 2018-02-02 | Disposition: A | Discharge status: Home / Self Care | Payer: Medicaid Other | Attending: Obstetrics and Gynecology | Admitting: Obstetrics and Gynecology

## 2018-02-02 DIAGNOSIS — M549 Dorsalgia, unspecified: Secondary | ICD-10-CM | POA: Insufficient documentation

## 2018-02-02 DIAGNOSIS — H9192 Unspecified hearing loss, left ear: Secondary | ICD-10-CM | POA: Diagnosis not present

## 2018-02-02 DIAGNOSIS — R109 Unspecified abdominal pain: Secondary | ICD-10-CM | POA: Insufficient documentation

## 2018-02-02 DIAGNOSIS — O26893 Other specified pregnancy related conditions, third trimester: Secondary | ICD-10-CM | POA: Diagnosis not present

## 2018-02-02 DIAGNOSIS — Z3A34 34 weeks gestation of pregnancy: Secondary | ICD-10-CM | POA: Diagnosis not present

## 2018-02-02 DIAGNOSIS — M545 Low back pain: Secondary | ICD-10-CM

## 2018-02-02 DIAGNOSIS — O4703 False labor before 37 completed weeks of gestation, third trimester: Secondary | ICD-10-CM

## 2018-02-02 DIAGNOSIS — Z886 Allergy status to analgesic agent status: Secondary | ICD-10-CM | POA: Diagnosis not present

## 2018-02-02 DIAGNOSIS — Z91018 Allergy to other foods: Secondary | ICD-10-CM | POA: Diagnosis not present

## 2018-02-02 DIAGNOSIS — O9989 Other specified diseases and conditions complicating pregnancy, childbirth and the puerperium: Secondary | ICD-10-CM

## 2018-02-02 LAB — URINALYSIS, COMPLETE (UACMP) WITH MICROSCOPIC
Bilirubin Urine: NEGATIVE
Glucose, UA: >=500 mg/dL — AB
HGB URINE DIPSTICK: NEGATIVE
KETONES UR: NEGATIVE mg/dL
Leukocytes, UA: NEGATIVE
Nitrite: NEGATIVE
PROTEIN: NEGATIVE mg/dL
Specific Gravity, Urine: 1.015 (ref 1.005–1.030)
pH: 5 (ref 5.0–8.0)

## 2018-02-02 MED ORDER — ACETAMINOPHEN 325 MG PO TABS: 650.0000 mg | ORAL_TABLET | ORAL | Status: DC | PRN

## 2018-02-02 MED ORDER — CYCLOBENZAPRINE HCL 10 MG PO TABS: 10.0000 mg | ORAL_TABLET | Freq: Three times a day (TID) | ORAL | 2 refills | Status: DC | PRN

## 2018-02-02 NOTE — Final Progress Note (Signed)
Physician Final Progress Note  Patient ID: BALINDA HEACOCK MRN: [id number] DOB/AGE: 25-29-1992 26 y.o.  Admit date: 02/02/2018 Admitting provider: Natale Milch, MD Discharge date: 02/02/2018  Admission Diagnoses:  Indication for care in labor and delivery, antepartum  Discharge Diagnoses:  Active Problems:   * No active hospital problems. *  History of Present Illness: The patient is a 27 y.o. female G1P0 at [redacted]w[redacted]d who presents for intermittent pain in her back that radiates to her lower abdomen. She states that the pain began last night around 2000. She tried taking Tylenol and showering without significant relief. She rated the intensity as 8/10. She endorses good fetal movement. She denies any vaginal bleeding, increase in discharge, or loss of fluid. Her pain improved with rest in triage.  Past Medical History:  Diagnosis Date  . Deaf, left   . Medical history non-contributory     Past Surgical History:  Procedure Laterality Date  . NO PAST SURGERIES      No current facility-administered medications on file prior to encounter.    Current Outpatient Medications on File Prior to Encounter  Medication Sig Dispense Refill  . prenatal vitamin w/FE, FA (PRENATAL 1 + 1) 27-1 MG TABS tablet Take 1 tablet by mouth daily at 12 noon.      Allergies  Allergen Reactions  . Ibuprofen Swelling  . Kiwi Extract     Swollen and itchy    Social History   Socioeconomic History  . Marital status: Single    Spouse name: Not on file  . Number of children: Not on file  . Years of education: Not on file  . Highest education level: Not on file  Occupational History  . Not on file  Social Needs  . Financial resource strain: Not on file  . Food insecurity:    Worry: Not on file    Inability: Not on file  . Transportation needs:    Medical: Not on file    Non-medical: Not on file  Tobacco Use  . Smoking status: Never Smoker  . Smokeless tobacco: Never Used  Substance and  Sexual Activity  . Alcohol use: No  . Drug use: No  . Sexual activity: Yes    Comment: Not in the last 24 hours  Lifestyle  . Physical activity:    Days per week: Not on file    Minutes per session: Not on file  . Stress: Not on file  Relationships  . Social connections:    Talks on phone: Not on file    Gets together: Not on file    Attends religious service: Not on file    Active member of club or organization: Not on file    Attends meetings of clubs or organizations: Not on file    Relationship status: Not on file  . Intimate partner violence:    Fear of current or ex partner: Not on file    Emotionally abused: Not on file    Physically abused: Not on file    Forced sexual activity: Not on file  Other Topics Concern  . Not on file  Social History Narrative  . Not on file   History reviewed. No pertinent family history.   Physical Exam: BP 111/81 (BP Location: Left Arm)   Pulse 92   Temp 97.9 F (36.6 C) (Oral)   Resp 16   Ht 5\' 3"  (1.6 m)   Wt 262 lb (118.8 kg)   LMP 05/04/2017   BMI 46.41  kg/m   Gen: NAD CV: Regular rate Pulm: No increased work of breathing Pelvic: Deferred, no preterm labor symptoms and no contractions on toco or palpable Ext: No signs of DVT  NST Baseline: 125 Variability: moderate Accelerations: present Decelerations: absent Tocometry: no contractions The patient was monitored for 30+ minutes, fetal heart rate tracing was deemed reactive, category I tracing.  Consults: None  Significant Findings/ Diagnostic Studies: UA unremarkable for indications of infection, urine culture pending  Procedures: Reactive NST  Discharge Condition: stable  Disposition: Discharge disposition: 01-Home or Self Care      Diet: Regular diet  Discharge Activity: Activity as tolerated  Discharge Instructions    Discharge activity:  No Restrictions   Complete by:  As directed    Discharge diet:  No restrictions   Complete by:  As directed     Fetal Kick Count:  Lie on our left side for one hour after a meal, and count the number of times your baby kicks.  If it is less than 5 times, get up, move around and drink some juice.  Repeat the test 30 minutes later.  If it is still less than 5 kicks in an hour, notify your doctor.   Complete by:  As directed    LABOR:  When conractions begin, you should start to time them from the beginning of one contraction to the beginning  of the next.  When contractions are 5 - 10 minutes apart or less and have been regular for at least an hour, you should call your health care provider.   Complete by:  As directed    No sexual activity restrictions   Complete by:  As directed    Notify physician for bleeding from the vagina   Complete by:  As directed    Notify physician for blurring of vision or spots before the eyes   Complete by:  As directed    Notify physician for chills or fever   Complete by:  As directed    Notify physician for fainting spells, "black outs" or loss of consciousness   Complete by:  As directed    Notify physician for increase in vaginal discharge   Complete by:  As directed    Notify physician for leaking of fluid   Complete by:  As directed    Notify physician for pain or burning when urinating   Complete by:  As directed    Notify physician for pelvic pressure (sudden increase)   Complete by:  As directed    Notify physician for severe or continued nausea or vomiting   Complete by:  As directed    Notify physician for sudden gushing of fluid from the vagina (with or without continued leaking)   Complete by:  As directed    Notify physician for sudden, constant, or occasional abdominal pain   Complete by:  As directed    Notify physician if baby moving less than usual   Complete by:  As directed      Allergies as of 02/02/2018      Reactions   Ibuprofen Swelling   Kiwi Extract    Swollen and itchy      Medication List    TAKE these medications    cyclobenzaprine 10 MG tablet Commonly known as:  FLEXERIL Take 1 tablet (10 mg total) by mouth 3 (three) times daily as needed for muscle spasms.   prenatal vitamin w/FE, FA 27-1 MG Tabs tablet Take 1 tablet by mouth  daily at 12 noon.      Discussed possible reasons for pain with patient; most likely musculoskeletal in origin and will try Flexeril and heating pad application for relief. Advised return to triage with worsening or difference in symptoms.  Signed: Oswaldo Conroy, CNM  02/02/2018, 10:14 AM

## 2018-02-02 NOTE — OB Triage Note (Signed)
Pt presents to L&D with c/o contractions every 5-10 min at 8/10 pain starting yesterday, 7/15 at 2000. Pt denies vaginal bleeding, leaking of fluid, or decreased fetal movement at this time. Toco and EFM applied and explained. All questions answered.

## 2018-02-02 NOTE — Discharge Summary (Signed)
See Final Progress Note 02/02/2018.

## 2018-02-03 LAB — URINE CULTURE

## 2018-02-04 ENCOUNTER — Telehealth: Payer: Self-pay

## 2018-02-04 NOTE — Telephone Encounter (Signed)
Pt states she went to the bathroom and there were 2 specks, size of the end of a fingernail, of dried blood. She states she has not had intercourse, and nothing in vagina.  No bleeding/spotting now. States she just went to ER for contractions.   Pt aware this can be normal as the cervix is very vascular and any irritation can cause spotting. Advised to keep an eye on things, if starts to have major cramping/bleeding to call and get an appointment or go to ER.   Pt voiced an understanding.  KJ CMA

## 2018-02-04 NOTE — Progress Notes (Signed)
Recollect next visit, released to mychart.

## 2018-02-15 ENCOUNTER — Ambulatory Visit (INDEPENDENT_AMBULATORY_CARE_PROVIDER_SITE_OTHER): Payer: Medicaid Other

## 2018-02-15 ENCOUNTER — Ambulatory Visit (INDEPENDENT_AMBULATORY_CARE_PROVIDER_SITE_OTHER): Payer: Medicaid Other | Admitting: Obstetrics and Gynecology

## 2018-02-15 ENCOUNTER — Other Ambulatory Visit (HOSPITAL_COMMUNITY)
Admission: RE | Admit: 2018-02-15 | Discharge: 2018-02-15 | Disposition: A | Discharge status: Home / Self Care | Payer: Medicaid Other | Source: Ambulatory Visit | Attending: Obstetrics and Gynecology | Admitting: Obstetrics and Gynecology

## 2018-02-15 VITALS — BP 122/80 | Wt 260.0 lb

## 2018-02-15 DIAGNOSIS — O0993 Supervision of high risk pregnancy, unspecified, third trimester: Secondary | ICD-10-CM | POA: Diagnosis not present

## 2018-02-15 DIAGNOSIS — O99213 Obesity complicating pregnancy, third trimester: Secondary | ICD-10-CM | POA: Diagnosis not present

## 2018-02-15 DIAGNOSIS — Z3A36 36 weeks gestation of pregnancy: Secondary | ICD-10-CM | POA: Insufficient documentation

## 2018-02-15 DIAGNOSIS — Z3685 Encounter for antenatal screening for Streptococcus B: Secondary | ICD-10-CM

## 2018-02-15 DIAGNOSIS — O99210 Obesity complicating pregnancy, unspecified trimester: Secondary | ICD-10-CM

## 2018-02-15 DIAGNOSIS — Z113 Encounter for screening for infections with a predominantly sexual mode of transmission: Secondary | ICD-10-CM

## 2018-02-15 NOTE — Progress Notes (Signed)
ROB GBS  Growth U/S

## 2018-02-15 NOTE — Progress Notes (Signed)
Routine Prenatal Care Visit  Subjective  Alexis Horne is a 27 y.o. G1P0 at 8366w0d being seen today for ongoing prenatal care.  She is currently monitored for the following issues for this low-risk pregnancy and has Supervision of high-risk pregnancy; BMI 45.0-49.9, adult (HCC); Maternal obesity, antepartum; Abdominal pain during pregnancy; and Labor and delivery indication for care or intervention on their problem list.  ----------------------------------------------------------------------------------- Patient reports no complaints.   Contractions: Irregular. Vag. Bleeding: None.  Movement: Present. Denies leaking of fluid.  ----------------------------------------------------------------------------------- The following portions of the patient's history were reviewed and updated as appropriate: allergies, current medications, past family history, past medical history, past social history, past surgical history and problem list. Problem list updated.   Objective  Blood pressure 122/80, weight 260 lb (117.9 kg), last menstrual period 05/04/2017. Pregravid weight 230 lb (104.3 kg) Total Weight Gain 30 lb (13.6 kg)   Urinalysis: Urine Protein: Negative Urine Glucose: Negative  Fetal Status: Fetal Heart Rate (bpm): 135 Fundal Height: 36 cm Movement: Present  Presentation: Vertex  General:  Alert, oriented and cooperative. Patient is in no acute distress.  Skin: Skin is warm and dry. No rash noted.   Cardiovascular: Normal heart rate noted  Respiratory: Normal respiratory effort, no problems with respiration noted  Abdomen: Soft, gravid, appropriate for gestational age. Pain/Pressure: Absent     Pelvic:  Cervical exam performed Dilation: Closed      Extremities: Normal range of motion.     ental Status: Normal mood and affect. Normal behavior. Normal judgment and thought content.   [place] Ob Follow Up  Result Date: 02/15/2018 ULTRASOUND REPORT Patient Name: Alexis Horne DOB:  04/29/1991 MRN: [id number] Location: [facility] Date of Service: 02/15/2018 Indications:growth/afi Findings: Mason JimSingleton intrauterine pregnancy is visualized with FHR at 140 BPM. Biometrics give an (U/S) Gestational age of 60109w3d and an (U/S) EDD of 03/19/2018; this correlates with the clinically established Estimated Date of Delivery: 03/15/18. Fetal presentation is Cephalic. Placenta: Anterior, grade 1. AFI: 21.14 cm Growth percentile is 42nd. EFW: 6lbs 1oz/ 2745g Impression: 1. 5066w0d Viable Singleton Intrauterine pregnancy previously established criteria. 2. Growth is 42nd %ile.  AFI is 21.14 cm. Recommendations: 1.Clinical correlation with the patient's History and Physical Exam. Willette AlmaKristen Priestley, RDMS, RVT There is a singleton gestation with normal amniotic fluid volume. The fetal biometry correlates with established dating.  Limited fetal anatomy was performed.The visualized fetal anatomical survey appears within normal limits within the resolution of ultrasound as described above.  It must be noted that a normal ultrasound is unable to rule out fetal aneuploidy.  Vena AustriaAndreas Shuna Tabor, MD, Merlinda FrederickFACOG [facility], [facility] 02/15/2018, 10:08 AM   [place] Ob Follow Up  Result Date: 01/19/2018 ULTRASOUND REPORT Patient Name: Alexis Horne DOB: 09/17/1990 MRN: [id number] Location: [facility] Date of Service: 01/19/2018 Indications:growth/afi Findings: Mason JimSingleton intrauterine pregnancy is visualized with FHR at 141 BPM. Biometrics give an (U/S) Gestational age of 2518w1d and an (U/S) EDD of 03/08/2018; this correlates with the clinically established Estimated Date of Delivery: 03/15/18. Fetal presentation is Cephalic. Placenta: Anterior, Grade 1. AFI: 14.97 cm Growth percentile is 65th. EFW: 4lbs 13oz Impression: 1. 653w1d Viable Singleton Intrauterine pregnancy previously established criteria. 2. Growth is 65 %ile.  AFI is 14.97 cm. Recommendations: 1.Clinical correlation with the patient's History and  Physical Exam. Awanda MinkAshley Corbin RDMS, RVT I have reviewed this ultrasound and the report. I agree with the above assessment and plan. Adelene Idlerhristanna Schuman MD [facility] 01/19/18  6:57 PM      Assessment   27 y.o. G1P0 at [redacted]w[redacted]d by  03/15/2018, by Ultrasound presenting for routine prenatal visit  Plan   FIRST Problems (from 08/19/17 to present)    Problem Noted Resolved   Maternal obesity, antepartum 11/16/2017 by Vena Austria, MD No   Supervision of high-risk pregnancy 08/19/2017 by Oswaldo Conroy, CNM No   Overview Addendum 01/19/2018  7:05 PM by Natale Milch, MD    Clinic [facility] Prenatal Labs  Dating 5 week [place] Blood type: A/Negative/-- (01/30 0953)   Genetic Screen NIPS: Negative, XY Antibody:Negative (01/30 0953)  Anatomic [place] Normal female Rubella: <0.90 (01/30 0953) Varicella: Immune  GTT Early:73 Third trimester: 119 RPR: Non Reactive (01/30 0953)   Rhogam 12/15/17 HBsAg: Negative (01/30 0953)   TDaP vaccine 06/25/19Flu Shot: 09/18/17 HIV: Non Reactive (01/30 0953)   Baby Food  Breast                              GBS:   Contraception  IUD Pap: Needs postpartum  Support Person              BMI 45.0-49.9, adult (HCC) 08/19/2017 by Oswaldo Conroy, CNM No       Gestational age appropriate obstetric precautions including but not limited to vaginal bleeding, contractions, leaking of fluid and fetal movement were reviewed in detail with the patient.   - appropriate growth and AFI - was not put on for NST start NST next week - GBS aptima collected  Return in about 1 week (around 02/22/2018) for NST/AFI.  Vena Austria, MD, Evern Core [facility], [facility] 02/15/2018, 10:56 AM

## 2018-02-16 ENCOUNTER — Inpatient Hospital Stay
Admission: EM | Admit: 2018-02-16 | Discharge: 2018-02-16 | Disposition: A | Discharge status: Home / Self Care | Payer: Medicaid Other | Attending: Obstetrics and Gynecology | Admitting: Obstetrics and Gynecology

## 2018-02-16 ENCOUNTER — Other Ambulatory Visit: Payer: Self-pay

## 2018-02-16 DIAGNOSIS — Z3A36 36 weeks gestation of pregnancy: Secondary | ICD-10-CM | POA: Diagnosis not present

## 2018-02-16 DIAGNOSIS — Z6841 Body Mass Index (BMI) 40.0 and over, adult: Secondary | ICD-10-CM

## 2018-02-16 DIAGNOSIS — O468X3 Other antepartum hemorrhage, third trimester: Secondary | ICD-10-CM | POA: Insufficient documentation

## 2018-02-16 DIAGNOSIS — O99210 Obesity complicating pregnancy, unspecified trimester: Secondary | ICD-10-CM

## 2018-02-16 DIAGNOSIS — O0993 Supervision of high risk pregnancy, unspecified, third trimester: Secondary | ICD-10-CM

## 2018-02-16 DIAGNOSIS — O26853 Spotting complicating pregnancy, third trimester: Secondary | ICD-10-CM | POA: Diagnosis not present

## 2018-02-16 MED ORDER — ACETAMINOPHEN 325 MG PO TABS: 650.0000 mg | ORAL_TABLET | ORAL | Status: DC | PRN

## 2018-02-16 NOTE — OB Triage Note (Signed)
Pt G1P0 1039w1d complains of vaginal bleeding since 0550 this morning. Pt states when she went to the bathroom "she wiped and there was blood". The pt states pink tinged bloody show. Pt denies any pain of LOF. VSS. Monitors applied and assessing.

## 2018-02-16 NOTE — Discharge Summary (Signed)
See final progress note. 

## 2018-02-16 NOTE — Final Progress Note (Signed)
Physician Final Progress Note  Patient ID: Alexis Horne MRN: [id number] DOB/AGE: 25/08/1990 26 y.o.  Admit date: 02/16/2018 Admitting provider: Vena AustriaAndreas Kammi Hechler, MD Discharge date: 02/16/2018   Admission Diagnoses: Vaginal bleeding  Discharge Diagnoses:  Active Problems:   Labor and delivery indication for care or intervention  27 y.o. G1P0 at 3852w1d presenting with light pink tinged discharge.  No abdominal pain, contractions, or trauma.  Cervical check yesterday afternoon.  +FM, no LOF, no VB  Blood pressure 107/75, pulse 89, temperature 98.2 F (36.8 C), temperature source Oral, resp. rate 16, height 5\' 3"  (1.6 m), weight 260 lb (117.9 kg), last menstrual period 05/04/2017.   Consults: None  Significant Findings/ Diagnostic Studies: none  Procedures:  Baseline: 120 Variability: moderate Accelerations: present Decelerations: absent Tocometry: none The patient was monitored for 30 minutes, fetal heart rate tracing was deemed reactive, category I tracing,  Discharge Condition: good  Disposition: Discharge disposition: 01-Home or Self Care       Diet: Regular diet  Discharge Activity: Activity as tolerated  Discharge Instructions    Discharge activity:  No Restrictions   Complete by:  As directed    Discharge diet:  No restrictions   Complete by:  As directed    Fetal Kick Count:  Lie on our left side for one hour after a meal, and count the number of times your baby kicks.  If it is less than 5 times, get up, move around and drink some juice.  Repeat the test 30 minutes later.  If it is still less than 5 kicks in an hour, notify your doctor.   Complete by:  As directed    LABOR:  When conractions begin, you should start to time them from the beginning of one contraction to the beginning  of the next.  When contractions are 5 - 10 minutes apart or less and have been regular for at least an hour, you should call your health care provider.   Complete by:  As  directed    No sexual activity restrictions   Complete by:  As directed    Notify physician for bleeding from the vagina   Complete by:  As directed    Notify physician for blurring of vision or spots before the eyes   Complete by:  As directed    Notify physician for chills or fever   Complete by:  As directed    Notify physician for fainting spells, "black outs" or loss of consciousness   Complete by:  As directed    Notify physician for increase in vaginal discharge   Complete by:  As directed    Notify physician for leaking of fluid   Complete by:  As directed    Notify physician for pain or burning when urinating   Complete by:  As directed    Notify physician for pelvic pressure (sudden increase)   Complete by:  As directed    Notify physician for severe or continued nausea or vomiting   Complete by:  As directed    Notify physician for sudden gushing of fluid from the vagina (with or without continued leaking)   Complete by:  As directed    Notify physician for sudden, constant, or occasional abdominal pain   Complete by:  As directed    Notify physician if baby moving less than usual   Complete by:  As directed      Allergies as of 02/16/2018      Reactions  Ibuprofen Swelling   Kiwi Extract    Swollen and itchy      Medication List    TAKE these medications   cyclobenzaprine 10 MG tablet Commonly known as:  FLEXERIL Take 1 tablet (10 mg total) by mouth 3 (three) times daily as needed for muscle spasms.   prenatal vitamin w/FE, FA 27-1 MG Tabs tablet Take 1 tablet by mouth daily at 12 noon.        Total time spent taking care of this patient: 20 minutes  Signed: Vena Austria 02/16/2018, 8:53 AM

## 2018-02-17 LAB — CERVICOVAGINAL ANCILLARY ONLY
CHLAMYDIA, DNA PROBE: NEGATIVE
NEISSERIA GONORRHEA: NEGATIVE

## 2018-02-17 LAB — STREP GP B NAA: STREP GROUP B AG: NEGATIVE

## 2018-02-23 ENCOUNTER — Ambulatory Visit (INDEPENDENT_AMBULATORY_CARE_PROVIDER_SITE_OTHER): Payer: Medicaid Other | Admitting: Maternal Newborn

## 2018-02-23 ENCOUNTER — Encounter: Payer: Self-pay | Admitting: Maternal Newborn

## 2018-02-23 ENCOUNTER — Ambulatory Visit (INDEPENDENT_AMBULATORY_CARE_PROVIDER_SITE_OTHER): Payer: Medicaid Other

## 2018-02-23 VITALS — BP 110/80 | Wt 264.0 lb

## 2018-02-23 DIAGNOSIS — O0993 Supervision of high risk pregnancy, unspecified, third trimester: Secondary | ICD-10-CM

## 2018-02-23 DIAGNOSIS — O99210 Obesity complicating pregnancy, unspecified trimester: Secondary | ICD-10-CM

## 2018-02-23 DIAGNOSIS — Z3A36 36 weeks gestation of pregnancy: Secondary | ICD-10-CM

## 2018-02-23 DIAGNOSIS — Z3A37 37 weeks gestation of pregnancy: Secondary | ICD-10-CM

## 2018-02-23 DIAGNOSIS — O9989 Other specified diseases and conditions complicating pregnancy, childbirth and the puerperium: Secondary | ICD-10-CM

## 2018-02-23 DIAGNOSIS — O99213 Obesity complicating pregnancy, third trimester: Secondary | ICD-10-CM

## 2018-02-23 DIAGNOSIS — R109 Unspecified abdominal pain: Secondary | ICD-10-CM

## 2018-02-23 DIAGNOSIS — Z369 Encounter for antenatal screening, unspecified: Secondary | ICD-10-CM

## 2018-02-23 LAB — FETAL NONSTRESS TEST

## 2018-02-23 NOTE — Progress Notes (Signed)
No concerns.rj 

## 2018-02-23 NOTE — Patient Instructions (Signed)
Vaginal Delivery Vaginal delivery means that you will give birth by pushing your baby out of your birth canal (vagina). A team of health care providers will help you before, during, and after vaginal delivery. Birth experiences are unique for every woman and every pregnancy, and birth experiences vary depending on where you choose to give birth. What should I do to prepare for my baby's birth? Before your baby is born, it is important to talk with your health care provider about:  Your labor and delivery preferences. These may include: ? Medicines that you may be given. ? How you will manage your pain. This might include non-medical pain relief techniques or injectable pain relief such as epidural analgesia. ? How you and your baby will be monitored during labor and delivery. ? Who may be in the labor and delivery room with you. ? Your feelings about surgical delivery of your baby (cesarean delivery, or C-section) if this becomes necessary. ? Your feelings about receiving donated blood through an IV tube (blood transfusion) if this becomes necessary.  Whether you are able: ? To take pictures or videos of the birth. ? To eat during labor and delivery. ? To move around, walk, or change positions during labor and delivery.  What to expect after your baby is born, such as: ? Whether delayed umbilical cord clamping and cutting is offered. ? Who will care for your baby right after birth. ? Medicines or tests that may be recommended for your baby. ? Whether breastfeeding is supported in your hospital or birth center. ? How long you will be in the hospital or birth center.  How any medical conditions you have may affect your baby or your labor and delivery experience.  To prepare for your baby's birth, you should also:  Attend all of your health care visits before delivery (prenatal visits) as recommended by your health care provider. This is important.  Prepare your home for your baby's  arrival. Make sure that you have: ? Diapers. ? Baby clothing. ? Feeding equipment. ? Safe sleeping arrangements for you and your baby.  Install a car seat in your vehicle. Have your car seat checked by a certified car seat installer to make sure that it is installed safely.  Think about who will help you with your new baby at home for at least the first several weeks after delivery.  What can I expect when I arrive at the birth center or hospital? Once you are in labor and have been admitted into the hospital or birth center, your health care provider may:  Review your pregnancy history and any concerns you have.  Insert an IV tube into one of your veins. This is used to give you fluids and medicines.  Check your blood pressure, pulse, temperature, and heart rate (vital signs).  Check whether your bag of water (amniotic sac) has broken (ruptured).  Talk with you about your birth plan and discuss pain control options.  Monitoring Your health care provider may monitor your contractions (uterine monitoring) and your baby's heart rate (fetal monitoring). You may need to be monitored:  Often, but not continuously (intermittently).  All the time or for long periods at a time (continuously). Continuous monitoring may be needed if: ? You are taking certain medicines, such as medicine to relieve pain or make your contractions stronger. ? You have pregnancy or labor complications.  Monitoring may be done by:  Placing a special stethoscope or a handheld monitoring device on your abdomen to   check your baby's heartbeat, and feeling your abdomen for contractions. This method of monitoring does not continuously record your baby's heartbeat or your contractions.  Placing monitors on your abdomen (external monitors) to record your baby's heartbeat and the frequency and length of contractions. You may not have to wear external monitors all the time.  Placing monitors inside of your uterus  (internal monitors) to record your baby's heartbeat and the frequency, length, and strength of your contractions. ? Your health care provider may use internal monitors if he or she needs more information about the strength of your contractions or your baby's heart rate. ? Internal monitors are put in place by passing a thin, flexible wire through your vagina and into your uterus. Depending on the type of monitor, it may remain in your uterus or on your baby's head until birth. ? Your health care provider will discuss the benefits and risks of internal monitoring with you and will ask for your permission before inserting the monitors.  Telemetry. This is a type of continuous monitoring that can be done with external or internal monitors. Instead of having to stay in bed, you are able to move around during telemetry. Ask your health care provider if telemetry is an option for you.  Physical exam Your health care provider may perform a physical exam. This may include:  Checking whether your baby is positioned: ? With the head toward your vagina (head-down). This is most common. ? With the head toward the top of your uterus (head-up or breech). If your baby is in a breech position, your health care provider may try to turn your baby to a head-down position so you can deliver vaginally. If it does not seem that your baby can be born vaginally, your provider may recommend surgery to deliver your baby. In rare cases, you may be able to deliver vaginally if your baby is head-up (breech delivery). ? Lying sideways (transverse). Babies that are lying sideways cannot be delivered vaginally.  Checking your cervix to determine: ? Whether it is thinning out (effacing). ? Whether it is opening up (dilating). ? How low your baby has moved into your birth canal.  What are the three stages of labor and delivery?  Normal labor and delivery is divided into the following three stages: Stage 1  Stage 1 is the  longest stage of labor, and it can last for hours or days. Stage 1 includes: ? Early labor. This is when contractions may be irregular, or regular and mild. Generally, early labor contractions are more than 10 minutes apart. ? Active labor. This is when contractions get longer, more regular, more frequent, and more intense. ? The transition phase. This is when contractions happen very close together, are very intense, and may last longer than during any other part of labor.  Contractions generally feel mild, infrequent, and irregular at first. They get stronger, more frequent (about every 2-3 minutes), and more regular as you progress from early labor through active labor and transition.  Many women progress through stage 1 naturally, but you may need help to continue making progress. If this happens, your health care provider may talk with you about: ? Rupturing your amniotic sac if it has not ruptured yet. ? Giving you medicine to help make your contractions stronger and more frequent.  Stage 1 ends when your cervix is completely dilated to 4 inches (10 cm) and completely effaced. This happens at the end of the transition phase. Stage 2  Once   your cervix is completely effaced and dilated to 4 inches (10 cm), you may start to feel an urge to push. It is common for the body to naturally take a rest before feeling the urge to push, especially if you received an epidural or certain other pain medicines. This rest period may last for up to 1-2 hours, depending on your unique labor experience.  During stage 2, contractions are generally less painful, because pushing helps relieve contraction pain. Instead of contraction pain, you may feel stretching and burning pain, especially when the widest part of your baby's head passes through the vaginal opening (crowning).  Your health care provider will closely monitor your pushing progress and your baby's progress through the vagina during stage 2.  Your  health care provider may massage the area of skin between your vaginal opening and anus (perineum) or apply warm compresses to your perineum. This helps it stretch as the baby's head starts to crown, which can help prevent perineal tearing. ? In some cases, an incision may be made in your perineum (episiotomy) to allow the baby to pass through the vaginal opening. An episiotomy helps to make the opening of the vagina larger to allow more room for the baby to fit through.  It is very important to breathe and focus so your health care provider can control the delivery of your baby's head. Your health care provider may have you decrease the intensity of your pushing, to help prevent perineal tearing.  After delivery of your baby's head, the shoulders and the rest of the body generally deliver very quickly and without difficulty.  Once your baby is delivered, the umbilical cord may be cut right away, or this may be delayed for 1-2 minutes, depending on your baby's health. This may vary among health care providers, hospitals, and birth centers.  If you and your baby are healthy enough, your baby may be placed on your chest or abdomen to help maintain the baby's temperature and to help you bond with each other. Some mothers and babies start breastfeeding at this time. Your health care team will dry your baby and help keep your baby warm during this time.  Your baby may need immediate care if he or she: ? Showed signs of distress during labor. ? Has a medical condition. ? Was born too early (prematurely). ? Had a bowel movement before birth (meconium). ? Shows signs of difficulty transitioning from being inside the uterus to being outside of the uterus. If you are planning to breastfeed, your health care team will help you begin a feeding. Stage 3  The third stage of labor starts immediately after the birth of your baby and ends after you deliver the placenta. The placenta is an organ that develops  during pregnancy to provide oxygen and nutrients to your baby in the womb.  Delivering the placenta may require some pushing, and you may have mild contractions. Breastfeeding can stimulate contractions to help you deliver the placenta.  After the placenta is delivered, your uterus should tighten (contract) and become firm. This helps to stop bleeding in your uterus. To help your uterus contract and to control bleeding, your health care provider may: ? Give you medicine by injection, through an IV tube, by mouth, or through your rectum (rectally). ? Massage your abdomen or perform a vaginal exam to remove any blood clots that are left in your uterus. ? Empty your bladder by placing a thin, flexible tube (catheter) into your bladder. ? Encourage   you to breastfeed your baby. After labor is over, you and your baby will be monitored closely to ensure that you are both healthy until you are ready to go home. Your health care team will teach you how to care for yourself and your baby. This information is not intended to replace advice given to you by your health care provider. Make sure you discuss any questions you have with your health care provider. Document Released: 04/15/2008 Document Revised: 01/25/2016 Document Reviewed: 07/22/2015 [employer] Interactive Patient Education  2018 [employer]  

## 2018-02-23 NOTE — Progress Notes (Signed)
    Routine Prenatal Care Visit  Subjective  Alexis Horne is a 27 y.o. G1P0 at 2373w1d being seen today for ongoing prenatal care.  She is currently monitored for the following issues for this high-risk pregnancy and has Supervision of high-risk pregnancy; BMI 45.0-49.9, adult (HCC); Maternal obesity, antepartum; and Abdominal pain during pregnancy on their problem list.  ----------------------------------------------------------------------------------- Patient reports fatigue.   Contractions: Not present. Vag. Bleeding: None.  Movement: Present. No leaking of fluid.  ----------------------------------------------------------------------------------- The following portions of the patient's history were reviewed and updated as appropriate: allergies, current medications, past family history, past medical history, past social history, past surgical history and problem list. Problem list updated.  Objective  Blood pressure 110/80, weight 264 lb (119.7 kg), last menstrual period 05/04/2017. Pregravid weight 230 lb (104.3 kg) Total Weight Gain 34 lb (15.4 kg) Body mass index is 46.77 kg/m. Urinalysis: Urine Protein: Trace Urine Glucose: Negative  Fetal Status: Fetal Heart Rate (bpm): 130   Movement: Present  Presentation: Vertex  General:  Alert, oriented and cooperative. Patient is in no acute distress.  Skin: Skin is warm and dry. No rash noted.   Cardiovascular: Normal heart rate noted  Respiratory: Normal respiratory effort, no problems with respiration noted  Abdomen: Soft, gravid, appropriate for gestational age. Pain/Pressure: Present     Pelvic:  Cervical exam performed Dilation: Closed Effacement (%): Thick Station: -3  Extremities: Normal range of motion.     Mental Status: Normal mood and affect. Normal behavior. Normal judgment and thought content.   NST Baseline: 130 Variability: moderate Accelerations: present Decelerations: absent Tocometry: not done The patient was  monitored for 20 minutes, fetal heart rate tracing was deemed reactive.  Assessment   27 y.o. G1P0 at 2673w1d, EDD 03/15/2018 by Ultrasound presenting for routine prenatal visit.  Plan   FIRST Problems (from 08/19/17 to present)    Problem Noted Resolved   Maternal obesity, antepartum 11/16/2017 by Vena AustriaStaebler, Andreas, MD No   Supervision of high-risk pregnancy 08/19/2017 by Oswaldo ConroySchmid, Rusti Arizmendi Y, CNM No   Overview Addendum 01/19/2018  7:05 PM by Natale MilchSchuman, Christanna R, MD    Clinic [facility] Prenatal Labs  Dating 5 week US Blood type: A/Negative/-- (01/30 0953)   Genetic Screen NIPS: Negative, XY Antibody:Negative (01/30 0953)  Anatomic US Normal female Rubella: <0.90 (01/30 0953) Varicella: Immune  GTT Early:73 Third trimester: 119 RPR: Non Reactive (01/30 0953)   Rhogam 12/15/17 HBsAg: Negative (01/30 0953)   TDaP vaccine 06/25/19Flu Shot: 09/18/17 HIV: Non Reactive (01/30 0953)   Baby Food  Breast                              GBS:   Contraception  IUD Pap: Needs postpartum  Support Person              BMI 45.0-49.9, adult (HCC) 08/19/2017 by Oswaldo ConroySchmid, Quentavious Rittenhouse Y, CNM No    AFI today is 10.79 cm.  Term labor symptoms and general obstetric precautions were reviewed.  Please refer to After Visit Summary for other counseling recommendations.   Return in about 1 week (around 03/02/2018) for ROB with NST/AFI.  Marcelyn BruinsJacelyn Pansy Ostrovsky, CNM 02/23/2018  3:59 PM

## 2018-03-02 ENCOUNTER — Ambulatory Visit (INDEPENDENT_AMBULATORY_CARE_PROVIDER_SITE_OTHER): Payer: Medicaid Other

## 2018-03-02 ENCOUNTER — Ambulatory Visit (INDEPENDENT_AMBULATORY_CARE_PROVIDER_SITE_OTHER): Payer: Medicaid Other | Admitting: Maternal Newborn

## 2018-03-02 ENCOUNTER — Encounter: Payer: Self-pay | Admitting: Maternal Newborn

## 2018-03-02 VITALS — BP 116/80 | Wt 266.0 lb

## 2018-03-02 DIAGNOSIS — Z3A38 38 weeks gestation of pregnancy: Secondary | ICD-10-CM

## 2018-03-02 DIAGNOSIS — Z369 Encounter for antenatal screening, unspecified: Secondary | ICD-10-CM

## 2018-03-02 DIAGNOSIS — O99213 Obesity complicating pregnancy, third trimester: Secondary | ICD-10-CM | POA: Diagnosis not present

## 2018-03-02 DIAGNOSIS — R109 Unspecified abdominal pain: Secondary | ICD-10-CM

## 2018-03-02 DIAGNOSIS — O9989 Other specified diseases and conditions complicating pregnancy, childbirth and the puerperium: Secondary | ICD-10-CM

## 2018-03-02 DIAGNOSIS — O99210 Obesity complicating pregnancy, unspecified trimester: Secondary | ICD-10-CM

## 2018-03-02 DIAGNOSIS — O0993 Supervision of high risk pregnancy, unspecified, third trimester: Secondary | ICD-10-CM

## 2018-03-02 LAB — POCT URINALYSIS DIPSTICK OB
Glucose, UA: NEGATIVE — AB
PROTEIN: NEGATIVE

## 2018-03-02 LAB — FETAL NONSTRESS TEST

## 2018-03-02 NOTE — Progress Notes (Signed)
C/o just ready.rj

## 2018-03-02 NOTE — Progress Notes (Signed)
    Routine Prenatal Care Visit  Subjective  Alexis Horne is a 27 y.o. G1P0 at 5258w1d being seen today for ongoing prenatal care.  She is currently monitored for the following issues for this high-risk pregnancy and has Supervision of high-risk pregnancy; BMI 45.0-49.9, adult (HCC); Maternal obesity, antepartum; and Abdominal pain during pregnancy on their problem list.  ----------------------------------------------------------------------------------- Patient reports no complaints.   Contractions: Not present. Vag. Bleeding: None.  Movement: Present. No leaking of fluid.  ----------------------------------------------------------------------------------- The following portions of the patient's hi34story were reviewed and updated as appropriate: allergies, current medications, past family history, past medical history, past social history, past surgical history and problem list. Problem list updated.   Objective  Blood pressure 130/90, weight 266 lb (120.7 kg), last menstrual period 05/04/2017. Pregravid weight 230 lb (104.3 kg) Total Weight Gain 36 lb (16.3 kg) Body mass index is 47.12 kg/m. Urinalysis: Glucose Negative, Protein Negative  Fetal Status: Fetal Heart Rate (bpm): 135 Fundal Height: 40 cm Movement: Present     General:  Alert, oriented and cooperative. Patient is in no acute distress.  Skin: Skin is warm and dry. No rash noted.   Cardiovascular: Normal heart rate noted  Respiratory: Normal respiratory effort, no problems with respiration noted  Abdomen: Soft, gravid, appropriate for gestational age. Pain/Pressure: Present     Pelvic:  Cervical exam performed        Extremities: Normal range of motion.     Mental Status: Normal mood and affect. Normal behavior. Normal judgment and thought content.   NST Baseline: 135 Variability: moderate Accelerations: present Decelerations: absent Tocometry: not done The patient was monitored for 20 minutes, fetal heart rate  tracing was deemed reactive.  Difficulty with machine tracing this visit. Adequate accelerations observed in room.  Assessment   27 y.o. G1P0 at 4258w1d, EDD 03/15/2018 by Ultrasound presenting for routine prenatal visit.  Plan   FIRST Problems (from 08/19/17 to present)    Problem Noted Resolved   Maternal obesity, antepartum 11/16/2017 by Vena AustriaStaebler, Andreas, MD No   Supervision of high-risk pregnancy 08/19/2017 by Oswaldo ConroySchmid, Horst Ostermiller Y, CNM No   Overview Addendum 01/19/2018  7:05 PM by Natale MilchSchuman, Christanna R, MD    [facility]inic [facility] Prenatal Labs  Dating 5 week US Blood type: A/Negative/-- (01/30 0953)   Genetic Screen NIPS: Negative, XY Antibody:Negative (01/30 0953)  Anatomic US Normal female Rubella: <0.90 (01/30 0953) Varicella: Immune  GTT Early:73 Third trimester: 119 RPR: Non Reactive (01/30 0953)   Rhogam 12/15/17 HBsAg: Negative (01/30 0953)   TDaP vaccine 06/25/19Flu Shot: 09/18/17 HIV: Non Reactive (01/30 0953)   Baby Food  Breast                              GBS:   Contraception  IUD Pap: Needs postpartum  Support Person              BMI 45.0-49.9, adult (HCC) 08/19/2017 by Oswaldo ConroySchmid, Kvion Shapley Y, CNM No    AFI is 12.41 cm today. NST reactive.  Discussion about induction of labor. She prefers 39 week timing.  We discussed methods, risks and benefits.  Term labor symptoms and general obstetric precautions were reviewed.  Please refer to After Visit Summary for other counseling recommendations.   Return in about 1 week (around 03/09/2018) for ROB with NST/AFI.  Marcelyn BruinsJacelyn Izella Ybanez, CNM 03/06/2018

## 2018-03-04 NOTE — Patient Instructions (Signed)
Labor Induction Labor induction is when steps are taken to cause a pregnant woman to begin the labor process. Most women go into labor on their own between 37 weeks and 42 weeks of the pregnancy. When this does not happen or when there is a medical need, methods may be used to induce labor. Labor induction causes a pregnant woman's uterus to contract. It also causes the cervix to soften (ripen), open (dilate), and thin out (efface). Usually, labor is not induced before 39 weeks of the pregnancy unless there is a problem with the baby or mother. Before inducing labor, your health care provider will consider a number of factors, including the following:  The medical condition of you and the baby.  How many weeks along you are.  The status of the baby's lung maturity.  The condition of the cervix.  The position of the baby. What are the reasons for labor induction? Labor may be induced for the following reasons:  The health of the baby or mother is at risk.  The pregnancy is overdue by 1 week or more.  The water breaks but labor does not start on its own.  The mother has a health condition or serious illness, such as high blood pressure, infection, placental abruption, or diabetes.  The amniotic fluid amounts are low around the baby.  The baby is distressed. Convenience or wanting the baby to be born on a certain date is not a reason for inducing labor. What methods are used for labor induction? Several methods of labor induction may be used, such as:  Prostaglandin medicine. This medicine causes the cervix to dilate and ripen. The medicine will also start contractions. It can be taken by mouth or by inserting a suppository into the vagina.  Inserting a thin tube (catheter) with a balloon on the end into the vagina to dilate the cervix. Once inserted, the balloon is expanded with water, which causes the cervix to open.  Stripping the membranes. Your health care provider separates  amniotic sac tissue from the cervix, causing the cervix to be stretched and causing the release of a hormone called progesterone. This may cause the uterus to contract. It is often done during an office visit. You will be sent home to wait for the contractions to begin. You will then come in for an induction.  Breaking the water. Your health care provider makes a hole in the amniotic sac using a small instrument. Once the amniotic sac breaks, contractions should begin. This may still take hours to see an effect.  Medicine to trigger or strengthen contractions. This medicine is given through an IV access tube inserted into a vein in your arm. All of the methods of induction, besides stripping the membranes, will be done in the hospital. Induction is done in the hospital so that you and the baby can be carefully monitored. How long does it take for labor to be induced? Some inductions can take up to 2-3 days. Depending on the cervix, it usually takes less time. It takes longer when you are induced early in the pregnancy or if this is your first pregnancy. If a mother is still pregnant and the induction has been going on for 2-3 days, either the mother will be sent home or a cesarean delivery will be needed. What are the risks associated with labor induction? Some of the risks of induction include:  Changes in fetal heart rate, such as too high, too low, or erratic.  Fetal distress.    Chance of infection for the mother and baby.  Increased chance of having a cesarean delivery.  Breaking off (abruption) of the placenta from the uterus (rare).  Uterine rupture (very rare). When induction is needed for medical reasons, the benefits of induction may outweigh the risks. What are some reasons for not inducing labor? Labor induction should not be done if:  It is shown that your baby does not tolerate labor.  You have had previous surgeries on your uterus, such as a myomectomy or the removal of  fibroids.  Your placenta lies very low in the uterus and blocks the opening of the cervix (placenta previa).  Your baby is not in a head-down position.  The umbilical cord drops down into the birth canal in front of the baby. This could cut off the baby's blood and oxygen supply.  You have had a previous cesarean delivery.  There are unusual circumstances, such as the baby being extremely premature. This information is not intended to replace advice given to you by your health care provider. Make sure you discuss any questions you have with your health care provider. Document Released: 11/26/2006 Document Revised: 12/13/2015 Document Reviewed: 02/03/2013 Elsevier Interactive Patient Education  2017 Elsevier Inc.  

## 2018-03-10 ENCOUNTER — Ambulatory Visit (INDEPENDENT_AMBULATORY_CARE_PROVIDER_SITE_OTHER): Payer: Medicaid Other | Admitting: Obstetrics and Gynecology

## 2018-03-10 ENCOUNTER — Ambulatory Visit (INDEPENDENT_AMBULATORY_CARE_PROVIDER_SITE_OTHER): Payer: Medicaid Other

## 2018-03-10 ENCOUNTER — Other Ambulatory Visit (HOSPITAL_COMMUNITY)
Admission: RE | Admit: 2018-03-10 | Discharge: 2018-03-10 | Disposition: A | Discharge status: Home / Self Care | Payer: Medicaid Other | Source: Ambulatory Visit | Attending: Obstetrics and Gynecology | Admitting: Obstetrics and Gynecology

## 2018-03-10 ENCOUNTER — Encounter: Payer: Self-pay | Admitting: Obstetrics and Gynecology

## 2018-03-10 VITALS — BP 120/80 | Wt 266.0 lb

## 2018-03-10 DIAGNOSIS — N898 Other specified noninflammatory disorders of vagina: Secondary | ICD-10-CM | POA: Diagnosis present

## 2018-03-10 DIAGNOSIS — Z369 Encounter for antenatal screening, unspecified: Secondary | ICD-10-CM

## 2018-03-10 DIAGNOSIS — Z3A39 39 weeks gestation of pregnancy: Secondary | ICD-10-CM

## 2018-03-10 DIAGNOSIS — Z3403 Encounter for supervision of normal first pregnancy, third trimester: Secondary | ICD-10-CM

## 2018-03-10 DIAGNOSIS — O0993 Supervision of high risk pregnancy, unspecified, third trimester: Secondary | ICD-10-CM

## 2018-03-10 DIAGNOSIS — O99213 Obesity complicating pregnancy, third trimester: Secondary | ICD-10-CM

## 2018-03-10 DIAGNOSIS — Z6841 Body Mass Index (BMI) 40.0 and over, adult: Secondary | ICD-10-CM

## 2018-03-10 LAB — FETAL NONSTRESS TEST

## 2018-03-10 NOTE — Progress Notes (Signed)
Routine Prenatal Care Visit  Subjective  Alexis Horne is a 27 y.o. G1P0 at 6267w2d being seen today for ongoing prenatal care.  She is currently monitored for the following issues for this high-risk pregnancy and has Supervision of high-risk pregnancy; BMI 45.0-49.9, adult (HCC); Maternal obesity, antepartum; and Abdominal pain during pregnancy on their problem list.  ----------------------------------------------------------------------------------- Patient reports no complaints and vaginal pressure.   Contractions: Irregular. Vag. Bleeding: None.  Movement: Present. Denies leaking of fluid.  ----------------------------------------------------------------------------------- The following portions of the patient's history were reviewed and updated as appropriate: allergies, current medications, past family history, past medical history, past social history, past surgical history and problem list. Problem list updated.   Objective  Blood pressure 120/80, weight 266 lb (120.7 kg), last menstrual period 05/04/2017. Pregravid weight 230 lb (104.3 kg) Total Weight Gain 36 lb (16.3 kg) Urinalysis:      Fetal Status: Fetal Heart Rate (bpm): 150   Movement: Present  Presentation: Vertex  General:  Alert, oriented and cooperative. Patient is in no acute distress.  Skin: Skin is warm and dry. No rash noted.   Cardiovascular: Normal heart rate noted  Respiratory: Normal respiratory effort, no problems with respiration noted  Abdomen: Soft, gravid, appropriate for gestational age. Pain/Pressure: Present     Pelvic:  Cervical exam performed        Extremities: Normal range of motion.     ental Status: Normal mood and affect. Normal behavior. Normal judgment and thought content.   NST: 13 bpm baseline, moderate variability, two 15x15 accelerations, no decelerations.  [place] Ob Limited  Result Date: 03/10/2018 ULTRASOUND REPORT Patient Name: Alexis Horne DOB: 02/19/1991 MRN: [id number]  Location: [facility] Date of Service: 03/10/2018 Indications: AFI Findings: Mason JimSingleton intrauterine pregnancy is visualized with FHR at 138 BPM. Fetal presentation is Cephalic. Placenta: Anterior, grade 1. AFI: 15.58 cm Impression: 1. 8767w2d Viable Singleton Intrauterine pregnancy dated by previously established criteria. 2. AFI is 15.58 cm. Recommendations: 1.Clinical correlation with the patient's History and Physical Exam. Willette AlmaKristen Priestley, RDMS, RVT I have reviewed this ultrasound and the report. I agree with the above assessment and plan. Adelene Idlerhristanna Schuman MD [facility] 03/10/18 3:09 PM      Assessment   26 y.o. G1P0 at 6367w2d by  03/15/2018, by Ultrasound presenting for routine prenatal visit  Plan   FIRST Problems (from 08/19/17 to present)    Problem Noted Resolved   Maternal obesity, antepartum 11/16/2017 by Vena AustriaStaebler, Andreas, MD No   Supervision of high-risk pregnancy 08/19/2017 by Oswaldo ConroySchmid, Jacelyn Y, CNM No   Overview Addendum 01/19/2018  7:05 PM by Natale MilchSchuman, Christanna R, MD    Clinic [facility] Prenatal Labs  Dating 5 week US Blood type: A/Negative/-- (01/30 0953)   Genetic Screen NIPS: Negative, XY Antibody:Negative (01/30 0953)  Anatomic US Normal female Rubella: <0.90 (01/30 0953) Varicella: Immune  GTT Early:73 Third trimester: 119 RPR: Non Reactive (01/30 0953)   Rhogam 12/15/17 HBsAg: Negative (01/30 0953)   TDaP vaccine 06/25/19Flu Shot: 09/18/17 HIV: Non Reactive (01/30 0953)   Baby Food  Breast                              GBS:   Contraception  IUD Pap: Needs postpartum  Support Person              BMI 45.0-49.9, adult (HCC) 08/19/2017 by Oswaldo ConroySchmid, Jacelyn Y, CNM No  Gestational age appropriate obstetric precautions including but not limited to vaginal bleeding, contractions, leaking of fluid and fetal movement were reviewed in detail with the patient.    IOL moved to 9/24 at 8am for bishop score = 7. aptima sent for vaginal  tenderness  No follow-ups on file.  Adelene Idlerhristanna Schuman MD [facility], [facility] 03/10/18 3:25 PM

## 2018-03-11 ENCOUNTER — Emergency Department
Admission: EM | Admit: 2018-03-11 | Discharge: 2018-03-11 | Disposition: A | Discharge status: Home / Self Care | Payer: Medicaid Other | Source: Home / Self Care | Attending: Emergency Medicine | Admitting: Emergency Medicine

## 2018-03-11 ENCOUNTER — Encounter: Payer: Self-pay | Admitting: Emergency Medicine

## 2018-03-11 ENCOUNTER — Other Ambulatory Visit: Payer: Self-pay

## 2018-03-11 DIAGNOSIS — M778 Other enthesopathies, not elsewhere classified: Secondary | ICD-10-CM

## 2018-03-11 NOTE — Discharge Instructions (Addendum)
Follow-up with your primary care provider if any continued problems with your wrist.  You may also follow-up with the orthopedist if continued problems in 2 to 3 weeks. Ice and elevation as needed.  Wear thumb spica splint for support of your right thumb.  You may continue taking Tylenol as needed.  After you deliver your baby this weekend you may consider taking anti-inflammatories if not breast-feeding.  Consult your doctor before doing this.

## 2018-03-11 NOTE — ED Provider Notes (Signed)
[facility] Emergency Department Provider Note  ____________________________________________   First MD Initiated Contact with Patient 03/11/18 [phone]     (approximate)  I have reviewed the triage vital signs and the nursing notes.   HISTORY  Chief Complaint Wrist Pain   HPI [name] is a 27 y.o. female presents to the emergency department with complaint of right wrist pain.  Patient states she had sudden onset of right wrist pain probably 4 days ago without any history of injury.  She denies any redness or heat.  Patient currently is [redacted] weeks pregnant and will be induced this weekend.  She has been taking some over-the-counter Tylenol infrequently with minimal improvement.  Past Medical History:  Diagnosis Date  . Deaf, left   . Medical history non-contributory     Patient Active Problem List   Diagnosis Date Noted  . Abdominal pain during pregnancy 11/23/2017  . Maternal obesity, antepartum 11/16/2017  . Supervision of high-risk pregnancy 08/19/2017  . BMI 45.0-49.9, adult (HCC) 08/19/2017    Past Surgical History:  Procedure Laterality Date  . NO PAST SURGERIES      Prior to Admission medications   Medication Sig Start Date End Date Taking? Authorizing Provider  prenatal vitamin w/FE, FA (PRENATAL 1 + 1) 27-1 MG TABS tablet Take 1 tablet by mouth daily at 12 noon.    [provider]    Allergies Ibuprofen and Kiwi extract  No family history on file.  Social History Social History   Tobacco Use  . Smoking status: Never Smoker  . Smokeless tobacco: Never Used  Substance Use Topics  . Alcohol use: No  . Drug use: No    Review of Systems Constitutional: No fever/chills Cardiovascular: Denies chest pain. Respiratory: Denies shortness of breath. Musculoskeletal: As of her right wrist pain. Skin: Negative for rash. Neurological: Negative for  focal weakness or  numbness. ___________________________________________   PHYSICAL EXAM:  VITAL SIGNS: ED Triage Vitals  Enc Vitals Group     BP      Pulse      Resp      Temp      Temp src      SpO2      Weight      Height      Head Circumference      Peak Flow      Pain Score      Pain Loc      Pain Edu?      Excl. in GC?    Constitutional: Alert and oriented. Well appearing and in no acute distress. Eyes: Conjunctivae are normal.  Head: Atraumatic. Neck: No stridor.   Cardiovascular: Normal rate, regular rhythm. Grossly normal heart sounds.  Good peripheral circulation. Respiratory: Normal respiratory effort.  No retractions. Lungs CTAB. Musculoskeletal: Examination of the right wrist there is no gross deformity and no obvious soft tissue swelling.  Patient is able to flex and extend her wrist without restriction however extension of her right thumb reproduces her pain.  Patient is able to grip bilaterally.  Good muscle strength is noted.  No discoloration is noted.  Nontender right epicondyle to palpation. Neurologic:  Normal speech and language. No gross focal neurologic deficits are appreciated.  Skin:  Skin is warm, dry and intact. No rash noted. Psychiatric: Mood and affect are normal. Speech and behavior are normal.  ____________________________________________   LABS (all labs ordered are listed, but only abnormal results are displayed)  Labs Reviewed - No  data to display  INITIAL IMPRESSION / ASSESSMENT AND PLAN / ED COURSE  As part of my medical decision making, I reviewed the following data within the electronic MEDICAL RECORD NUMBER Notes from prior ED visits and [place] Controlled Substance Database  Patient presents with complaint of right wrist pain with no known injury.  Patient currently is [redacted] weeks pregnant and plans on being induced this weekend.  She has been taking Tylenol with some relief of her pain.  She was placed in a thumb spica splint.  At this time with no history of  injury most likely she has a tendinitis of her right thumb.  Patient is also encouraged to ice and elevate as needed for discomfort and follow-up with her PCP if any continued problems.  ____________________________________________   FINAL CLINICAL IMPRESSION(S) / ED DIAGNOSES  Final diagnoses:  Tendinitis of right wrist     ED Discharge Orders    None       Note:  This document was prepared using Dragon voice recognition software and may include unintentional dictation errors.    Tommi Rumps, PA-C 03/11/18 1232    Merrily Brittle, MD 03/11/18 1357

## 2018-03-11 NOTE — ED Triage Notes (Signed)
Presents with right wrist pain for 4 days   Denies any injury   Good pulses  No deformity noted   Pt is [redacted] weeks pregnant

## 2018-03-12 ENCOUNTER — Inpatient Hospital Stay: Payer: Medicaid Other | Admitting: Anesthesiology

## 2018-03-12 ENCOUNTER — Encounter: Payer: Self-pay | Admitting: Obstetrics and Gynecology

## 2018-03-12 ENCOUNTER — Other Ambulatory Visit: Payer: Self-pay

## 2018-03-12 ENCOUNTER — Inpatient Hospital Stay
Admission: EM | Admit: 2018-03-12 | Discharge: 2018-03-14 | DRG: 807 | Disposition: A | Discharge status: Home / Self Care | Payer: Medicaid Other | Attending: Obstetrics and Gynecology | Admitting: Obstetrics and Gynecology

## 2018-03-12 DIAGNOSIS — Z6841 Body Mass Index (BMI) 40.0 and over, adult: Secondary | ICD-10-CM

## 2018-03-12 DIAGNOSIS — Z3A39 39 weeks gestation of pregnancy: Secondary | ICD-10-CM | POA: Diagnosis not present

## 2018-03-12 DIAGNOSIS — O4292 Full-term premature rupture of membranes, unspecified as to length of time between rupture and onset of labor: Secondary | ICD-10-CM | POA: Diagnosis present

## 2018-03-12 DIAGNOSIS — O26893 Other specified pregnancy related conditions, third trimester: Secondary | ICD-10-CM | POA: Diagnosis present

## 2018-03-12 DIAGNOSIS — E669 Obesity, unspecified: Secondary | ICD-10-CM | POA: Diagnosis present

## 2018-03-12 DIAGNOSIS — Z6791 Unspecified blood type, Rh negative: Secondary | ICD-10-CM

## 2018-03-12 DIAGNOSIS — O99214 Obesity complicating childbirth: Secondary | ICD-10-CM | POA: Diagnosis present

## 2018-03-12 LAB — TYPE AND SCREEN
ABO/RH(D): A NEG
Antibody Screen: NEGATIVE

## 2018-03-12 LAB — CBC
HCT: 40.8 % (ref 35.0–47.0)
Hemoglobin: 14.4 g/dL (ref 12.0–16.0)
MCH: 33 pg (ref 26.0–34.0)
MCHC: 35.2 g/dL (ref 32.0–36.0)
MCV: 93.6 fL (ref 80.0–100.0)
PLATELETS: 199 10*3/uL (ref 150–440)
RBC: 4.36 MIL/uL (ref 3.80–5.20)
RDW: 12.9 % (ref 11.5–14.5)
WBC: 11 10*3/uL (ref 3.6–11.0)

## 2018-03-12 LAB — CERVICOVAGINAL ANCILLARY ONLY
Bacterial vaginitis: NEGATIVE
CANDIDA VAGINITIS: NEGATIVE

## 2018-03-12 LAB — URINE DRUG SCREEN, QUALITATIVE (ARMC ONLY)
AMPHETAMINES, UR SCREEN: NOT DETECTED
Barbiturates, Ur Screen: NOT DETECTED
COCAINE METABOLITE, UR ~~LOC~~: NOT DETECTED
Cannabinoid 50 Ng, Ur ~~LOC~~: NOT DETECTED
MDMA (Ecstasy)Ur Screen: NOT DETECTED
METHADONE SCREEN, URINE: NOT DETECTED
OPIATE, UR SCREEN: NOT DETECTED
Phencyclidine (PCP) Ur S: NOT DETECTED
Tricyclic, Ur Screen: NOT DETECTED

## 2018-03-12 LAB — COMPREHENSIVE METABOLIC PANEL
ALBUMIN: 2.9 g/dL — AB (ref 3.5–5.0)
ALT: 7 U/L (ref 0–44)
AST: 22 U/L (ref 15–41)
Alkaline Phosphatase: 190 U/L — ABNORMAL HIGH (ref 38–126)
Anion gap: 9 (ref 5–15)
BUN: 10 mg/dL (ref 6–20)
CO2: 21 mmol/L — ABNORMAL LOW (ref 22–32)
CREATININE: 0.6 mg/dL (ref 0.44–1.00)
Calcium: 9.2 mg/dL (ref 8.9–10.3)
Chloride: 108 mmol/L (ref 98–111)
GFR calc Af Amer: >60 mL/min (ref >60)
GLUCOSE: 88 mg/dL (ref 70–99)
Potassium: 3.6 mmol/L (ref 3.5–5.1)
Sodium: 138 mmol/L (ref 135–145)
Total Bilirubin: 0.5 mg/dL (ref 0.3–1.2)
Total Protein: 6.5 g/dL (ref 6.5–8.1)

## 2018-03-12 LAB — PROTEIN / CREATININE RATIO, URINE
Creatinine, Urine: 179 mg/dL
Protein Creatinine Ratio: 0.12 mg/mg{Cre} (ref 0.00–0.15)
TOTAL PROTEIN, URINE: 22 mg/dL

## 2018-03-12 MED ORDER — AMMONIA AROMATIC IN INHA
RESPIRATORY_TRACT | Status: AC
  Filled 2018-03-12: qty 10

## 2018-03-12 MED ORDER — FENTANYL 2.5 MCG/ML W/ROPIVACAINE 0.15% IN NS 100 ML EPIDURAL (ARMC)
EPIDURAL | Status: AC
  Filled 2018-03-12: qty 100

## 2018-03-12 MED ORDER — ONDANSETRON HCL 4 MG/2ML IJ SOLN: 4.0000 mg | Freq: Four times a day (QID) | INTRAMUSCULAR | Status: DC | PRN

## 2018-03-12 MED ORDER — COCONUT OIL OIL: 1.0000 "application " | TOPICAL_OIL | Status: DC | PRN

## 2018-03-12 MED ORDER — SOD CITRATE-CITRIC ACID 500-334 MG/5ML PO SOLN: 30.0000 mL | ORAL | Status: DC | PRN

## 2018-03-12 MED ORDER — ACETAMINOPHEN 325 MG PO TABS
650.0000 mg | ORAL_TABLET | ORAL | Status: DC | PRN
  Administered 2018-03-12 – 2018-03-14 (×4): 650 mg via ORAL

## 2018-03-12 MED ORDER — BENZOCAINE-MENTHOL 20-0.5 % EX AERO
1.0000 "application " | INHALATION_SPRAY | CUTANEOUS | Status: DC | PRN
  Administered 2018-03-12: 1 via TOPICAL
  Filled 2018-03-12: qty 56

## 2018-03-12 MED ORDER — EPHEDRINE 5 MG/ML INJ
10.0000 mg | INTRAVENOUS | Status: DC | PRN
  Filled 2018-03-12: qty 2

## 2018-03-12 MED ORDER — DOCUSATE SODIUM 100 MG PO CAPS
ORAL_CAPSULE | ORAL | Status: AC
  Administered 2018-03-12: 100 mg
  Filled 2018-03-12: qty 1

## 2018-03-12 MED ORDER — LIDOCAINE HCL (PF) 1 % IJ SOLN
INTRAMUSCULAR | Status: AC
  Filled 2018-03-12: qty 30

## 2018-03-12 MED ORDER — OXYTOCIN 40 UNITS IN LACTATED RINGERS INFUSION - SIMPLE MED
2.5000 [IU]/h | INTRAVENOUS | Status: DC
  Filled 2018-03-12: qty 1000

## 2018-03-12 MED ORDER — BUPIVACAINE HCL (PF) 0.25 % IJ SOLN
INTRAMUSCULAR | Status: DC | PRN
  Administered 2018-03-12: 10 mL via EPIDURAL

## 2018-03-12 MED ORDER — MISOPROSTOL 200 MCG PO TABS
ORAL_TABLET | ORAL | Status: AC
  Administered 2018-03-12: 800 ug via RECTAL
  Filled 2018-03-12: qty 4

## 2018-03-12 MED ORDER — LACTATED RINGERS IV SOLN: 500.0000 mL | Freq: Once | INTRAVENOUS | Status: DC

## 2018-03-12 MED ORDER — FENTANYL 2.5 MCG/ML W/ROPIVACAINE 0.15% IN NS 100 ML EPIDURAL (ARMC)
12.0000 mL/h | EPIDURAL | Status: DC
  Administered 2018-03-12: 12 mL/h via EPIDURAL

## 2018-03-12 MED ORDER — PHENYLEPHRINE 40 MCG/ML (10ML) SYRINGE FOR IV PUSH (FOR BLOOD PRESSURE SUPPORT)
80.0000 ug | PREFILLED_SYRINGE | INTRAVENOUS | Status: DC | PRN
  Filled 2018-03-12: qty 5

## 2018-03-12 MED ORDER — MISOPROSTOL 200 MCG PO TABS
ORAL_TABLET | ORAL | Status: AC
  Filled 2018-03-12: qty 4

## 2018-03-12 MED ORDER — PHENYLEPHRINE 40 MCG/ML (10ML) SYRINGE FOR IV PUSH (FOR BLOOD PRESSURE SUPPORT)
80.0000 ug | PREFILLED_SYRINGE | Freq: Once | INTRAVENOUS | Status: AC
  Administered 2018-03-12: 80 ug via INTRAVENOUS

## 2018-03-12 MED ORDER — ONDANSETRON HCL 4 MG/2ML IJ SOLN: 4.0000 mg | INTRAMUSCULAR | Status: DC | PRN

## 2018-03-12 MED ORDER — DIPHENHYDRAMINE HCL 50 MG/ML IJ SOLN: 12.5000 mg | INTRAMUSCULAR | Status: DC | PRN

## 2018-03-12 MED ORDER — OXYCODONE HCL 5 MG PO TABS
5.0000 mg | ORAL_TABLET | ORAL | Status: DC | PRN
  Administered 2018-03-13 – 2018-03-14 (×2): 5 mg via ORAL
  Filled 2018-03-12 (×2): qty 1

## 2018-03-12 MED ORDER — OXYTOCIN 10 UNIT/ML IJ SOLN
INTRAMUSCULAR | Status: AC
  Filled 2018-03-12: qty 2

## 2018-03-12 MED ORDER — ONDANSETRON HCL 4 MG PO TABS
4.0000 mg | ORAL_TABLET | ORAL | Status: DC | PRN
  Administered 2018-03-13 (×2): 4 mg via ORAL
  Filled 2018-03-12 (×2): qty 1

## 2018-03-12 MED ORDER — ACETAMINOPHEN 325 MG PO TABS
650.0000 mg | ORAL_TABLET | ORAL | Status: DC | PRN
  Administered 2018-03-13: 650 mg via ORAL
  Filled 2018-03-12 (×5): qty 2

## 2018-03-12 MED ORDER — LACTATED RINGERS IV SOLN
INTRAVENOUS | Status: DC
  Administered 2018-03-12 (×2): via INTRAVENOUS

## 2018-03-12 MED ORDER — LIDOCAINE HCL (PF) 1 % IJ SOLN
30.0000 mL | INTRAMUSCULAR | Status: AC | PRN
  Administered 2018-03-12: 30 mL via SUBCUTANEOUS

## 2018-03-12 MED ORDER — BUTORPHANOL TARTRATE 1 MG/ML IJ SOLN
1.0000 mg | INTRAMUSCULAR | Status: DC | PRN
  Administered 2018-03-12 (×2): 1 mg via INTRAVENOUS
  Filled 2018-03-12 (×2): qty 1

## 2018-03-12 MED ORDER — LIDOCAINE-EPINEPHRINE (PF) 1.5 %-1:200000 IJ SOLN
INTRAMUSCULAR | Status: DC | PRN
  Administered 2018-03-12: 3 mL via PERINEURAL

## 2018-03-12 MED ORDER — LACTATED RINGERS IV SOLN
500.0000 mL | INTRAVENOUS | Status: DC | PRN
  Administered 2018-03-12: 300 mL via INTRAVENOUS
  Administered 2018-03-12: 250 mL via INTRAVENOUS
  Administered 2018-03-12: 500 mL via INTRAVENOUS

## 2018-03-12 MED ORDER — SIMETHICONE 80 MG PO CHEW
80.0000 mg | CHEWABLE_TABLET | ORAL | Status: DC | PRN
  Administered 2018-03-13: 80 mg via ORAL
  Filled 2018-03-12: qty 1

## 2018-03-12 MED ORDER — BENZOCAINE-MENTHOL 20-0.5 % EX AERO
INHALATION_SPRAY | CUTANEOUS | Status: AC
  Administered 2018-03-12: 1 via TOPICAL
  Filled 2018-03-12: qty 56

## 2018-03-12 MED ORDER — SENNOSIDES-DOCUSATE SODIUM 8.6-50 MG PO TABS
2.0000 | ORAL_TABLET | ORAL | Status: DC
  Administered 2018-03-13 (×2): 2 via ORAL
  Filled 2018-03-12 (×3): qty 2

## 2018-03-12 MED ORDER — OXYTOCIN BOLUS FROM INFUSION
500.0000 mL | Freq: Once | INTRAVENOUS | Status: AC
  Administered 2018-03-12: 500 mL via INTRAVENOUS

## 2018-03-12 MED ORDER — FERROUS SULFATE 325 (65 FE) MG PO TABS
325.0000 mg | ORAL_TABLET | Freq: Every day | ORAL | Status: DC
  Administered 2018-03-13 – 2018-03-14 (×2): 325 mg via ORAL
  Filled 2018-03-12 (×2): qty 1

## 2018-03-12 MED ORDER — PRENATAL MULTIVITAMIN CH
1.0000 | ORAL_TABLET | Freq: Every day | ORAL | Status: DC
  Administered 2018-03-13 – 2018-03-14 (×2): 1 via ORAL
  Filled 2018-03-12 (×2): qty 1

## 2018-03-12 MED ORDER — LIDOCAINE HCL (PF) 1 % IJ SOLN
INTRAMUSCULAR | Status: AC
  Administered 2018-03-12: 30 mL via SUBCUTANEOUS
  Filled 2018-03-12: qty 30

## 2018-03-12 MED ORDER — PRENATAL MULTIVITAMIN CH
ORAL_TABLET | ORAL | Status: AC
  Filled 2018-03-12: qty 1

## 2018-03-12 MED ORDER — PHENYLEPHRINE 40 MCG/ML (10ML) SYRINGE FOR IV PUSH (FOR BLOOD PRESSURE SUPPORT)
PREFILLED_SYRINGE | INTRAVENOUS | Status: AC
  Administered 2018-03-12: 80 ug via INTRAVENOUS
  Filled 2018-03-12: qty 10

## 2018-03-12 MED ORDER — LIDOCAINE HCL (PF) 2 % IJ SOLN
INTRAMUSCULAR | Status: DC | PRN
  Administered 2018-03-12: 3 mL via INTRADERMAL

## 2018-03-12 MED ORDER — METHYLERGONOVINE MALEATE 0.2 MG/ML IJ SOLN
INTRAMUSCULAR | Status: AC
  Administered 2018-03-12: 0.2 mg via INTRAMUSCULAR
  Filled 2018-03-12: qty 1

## 2018-03-12 MED ORDER — LIDOCAINE HCL (PF) 1 % IJ SOLN
INTRAMUSCULAR | Status: DC | PRN
  Administered 2018-03-12: 3 mL

## 2018-03-12 MED ORDER — OXYCODONE HCL 5 MG PO TABS
10.0000 mg | ORAL_TABLET | ORAL | Status: DC | PRN
  Filled 2018-03-12: qty 2

## 2018-03-12 MED ORDER — METHYLERGONOVINE MALEATE 0.2 MG/ML IJ SOLN
0.2000 mg | Freq: Once | INTRAMUSCULAR | Status: AC
  Administered 2018-03-12: 0.2 mg via INTRAMUSCULAR

## 2018-03-12 MED ORDER — DIBUCAINE 1 % RE OINT: 1.0000 "application " | TOPICAL_OINTMENT | RECTAL | Status: DC | PRN

## 2018-03-12 MED ORDER — WITCH HAZEL-GLYCERIN EX PADS: 1.0000 "application " | MEDICATED_PAD | CUTANEOUS | Status: DC | PRN

## 2018-03-12 NOTE — Anesthesia Procedure Notes (Addendum)
Epidural Patient location during procedure: OB Start time: 03/12/2018 6:29 AM End time: 03/12/2018 6:45 AM  Staffing Anesthesiologist: Yves Dillarroll, Yovanni Frenette, MD Performed: anesthesiologist   Preanesthetic Checklist Completed: patient identified, site marked, surgical consent, pre-op evaluation, timeout performed, IV checked, risks and benefits discussed and monitors and equipment checked  Epidural Patient position: sitting Prep: Betadine Patient monitoring: heart rate, continuous pulse ox and blood pressure Approach: midline Location: L3-L4 Injection technique: LOR air  Needle:  Needle type: Tuohy  Needle gauge: 17 G Needle length: 9 cm and 9 Catheter type: closed end flexible Catheter size: 19 Gauge Test dose: negative and 1.5% lidocaine with Epi 1:200 K  Assessment Events: blood not aspirated, injection not painful, no injection resistance, negative IV test and no paresthesia  Additional Notes Time out called .  Patient placed in sitting position.  Back prepped and draped in sterile fashion.  A skin wheal was made in the L3-L4 interspace with 1% Lidocaine plain. A 17G Tuohy needle was advanced to the epidural space by a loss of resistance technique.  No blood, paresthesias or fluid.  The epidural catheter was threaded 4 cm and the TD was negative.  No paresthesias with epidural threading .  The patient tolerated the procedure well and the catheter was affixed to the back in sterile fashion.  Easy epidural X 1.Reason for block:procedure for pain

## 2018-03-12 NOTE — Plan of Care (Signed)
Reviewed plan of care with patient. All questions answered. 

## 2018-03-12 NOTE — Anesthesia Preprocedure Evaluation (Signed)
Anesthesia Evaluation  Patient identified by MRN, date of birth, ID band Patient awake    Reviewed: Allergy & Precautions, NPO status , Patient's Chart, lab work & pertinent test results  Airway Mallampati: III       Dental  (+) Teeth Intact   Pulmonary neg pulmonary ROS,    Pulmonary exam normal        Cardiovascular negative cardio ROS Normal cardiovascular exam     Neuro/Psych negative psych ROS   GI/Hepatic negative GI ROS, Neg liver ROS,   Endo/Other  negative endocrine ROSMorbid obesity  Renal/GU negative Renal ROS  negative genitourinary   Musculoskeletal negative musculoskeletal ROS (+)   Abdominal Normal abdominal exam  (+)   Peds negative pediatric ROS (+)  Hematology   Anesthesia Other Findings   Reproductive/Obstetrics (+) Pregnancy                             Anesthesia Physical Anesthesia Plan  ASA: II  Anesthesia Plan: Epidural   Post-op Pain Management:    Induction:   PONV Risk Score and Plan:   Airway Management Planned: Natural Airway  Additional Equipment:   Intra-op Plan:   Post-operative Plan:   Informed Consent: I have reviewed the patients History and Physical, chart, labs and discussed the procedure including the risks, benefits and alternatives for the proposed anesthesia with the patient or authorized representative who has indicated his/her understanding and acceptance.   Dental advisory given  Plan Discussed with: CRNA and Surgeon  Anesthesia Plan Comments:         Anesthesia Quick Evaluation

## 2018-03-12 NOTE — Progress Notes (Signed)
Negative, Released to mychart 

## 2018-03-12 NOTE — Lactation Note (Signed)
This note was copied from a baby's chart. Lactation Consultation Note  Patient Name: Alexis Horne'UToday's Date: 03/12/2018 Reason for consult: Initial assessment;Primapara;1st time breastfeeding   Maternal Data Formula Feeding for Exclusion: No Does the patient have breastfeeding experience prior to this delivery?: No Mom has large breasts and baby needs support to maintain latch Feeding Feeding Type: Breast Fed Latches with shaping breast and support of breast, sucks a few times and then loses breast if not supported, falls asleep easily  LATCH Score Latch: Repeated attempts needed to sustain latch, nipple held in mouth throughout feeding, stimulation needed to elicit sucking reflex.  Audible Swallowing: A few with stimulation  Type of Nipple: Everted at rest and after stimulation(large heavy breasts)  Comfort (Breast/Nipple): Soft / non-tender  Hold (Positioning): Full assist, staff holds infant at breast  LATCH Score: 6  Interventions Interventions: Breast feeding basics reviewed;Assisted with latch;Skin to skin;Hand express;Breast compression;Adjust position;Support pillows;Position options  Lactation Tools Discussed/Used     Consult Status Consult Status: Follow-up Date: 03/12/18 Follow-up type: In-patient    Dyann KiefMarsha D Cathyann Kilfoyle 03/12/2018, 2:15 PM

## 2018-03-12 NOTE — OB Triage Note (Signed)
Pt presents to L&D with c/o LOF since 0120. Pt noted clear fluid after she voided. Pt denies contractions, but does feel "period cramping." Pt denies vaginal bleeding or decreased fetal movement. Toco and EFM applied and explained. All questions answered. Will monitor closely.

## 2018-03-12 NOTE — Discharge Summary (Signed)
Physician Obstetric Discharge Summary  Patient ID: Alexis Horne MRN: [id number] DOB/AGE: 25/08/1990 26 y.o.   Date of Admission: 03/12/2018 Date of Delivery: 12 March 2018 Date of Discharge:   Admitting Diagnosis: Onset of Labor at 47106w4d  Secondary Diagnosis: RH negative status  Mode of Delivery: normal spontaneous vaginal delivery 03/12/2018      Discharge Diagnosis: Term intrauterine pregnancy, delivered. Uterine atony   Intrapartum Procedures: epidural and repair of obstetric lacerations   Post partum procedures: rhogam  Complications: none   Brief Hospital Course  Alexis Horne is a G1P1001 who had a SVD on 03/12/2018;  for further details of this delivery, please refer to the delivery note.  Patient had an uncomplicated postpartum course.  By time of discharge on PPD#2, her pain was controlled on oral pain medications; she had appropriate lochia and was ambulating, voiding without difficulty and tolerating regular diet.  She was deemed stable for discharge to home.    ( Labs: CBC Latest Ref Rng & Units 03/13/2018 03/12/2018 12/15/2017  WBC 3.6 - 11.0 K/uL 12.4(H) 11.0 10.4  Hemoglobin 12.0 - 16.0 g/dL 11.3(L) 14.4 12.8  Hematocrit 35.0 - 47.0 % 32.3(L) 40.8 37.1  Platelets 150 - 440 K/uL 166 199 244   A NEG/ RNI/ VI  Physical exam:  Blood pressure 104/60, pulse 92, temperature 98.1 F (36.7 C), temperature source Oral, resp. rate 20, height 5\' 3"  (1.6 m), weight 120 kg, last menstrual period 05/04/2017, SpO2 98 %, currently breastfeeding. General: alert and no distress Lochia: appropriate Abdomen: soft, NT Uterine Fundus: firm Incision: NA Extremities: No evidence of DVT seen on physical exam. No lower extremity edema.  Discharge Instructions: Per After Visit Summary. Activity: Advance as tolerated. Pelvic rest for 6 weeks.  Also refer to Discharge Instructions Diet: Regular Medications: Allergies as of 03/14/2018      Reactions   Ibuprofen Swelling   Kiwi Extract    Swollen and itchy      Medication List    TAKE these medications   prenatal vitamin w/FE, FA 27-1 MG Tabs tablet Take 1 tablet by mouth daily at 12 noon.      Outpatient follow up:  Follow-up Information    Farrel ConnersGutierrez, Colleen, CNM. Schedule an appointment as soon as possible for a visit.   Specialty:  Certified Nurse Midwife Why:  for 6 week postpartum appointment and Mirena insertion Contact information: [address] [place] [id number] [phone]          Postpartum contraception: IUD Mirena  Discharged Condition: good  Discharged to: home   Newborn Data: female infant 7#6oz, Francesco SorLincoln Disposition:home with mother  Apgars: APGAR (1 MIN): 8   APGAR (5 MINS): 9   APGAR (10 MINS):    Baby Feeding: Breast and Formula  Tresea MallJane Brodin Gelpi, CNM 03/14/2018 9:01 AM

## 2018-03-12 NOTE — H&P (Signed)
Obstetric H&P   Chief Complaint: Leaking Fluid  Prenatal Care Provider: [facility]  History of Present Illness: 27 y.o. G1P0 3240w4d by 03/15/2018, by 5 week Ultrasound presenting to L&D with rupture of membranes, clear fluid around 0100.  +FM, no VB, +ctx.  Pregnancy complicated by only by obesity with normal glucose tolerance tests, growth scan 42%ile at 36 weeks, with normal weekly antepartum surveillance in the third trimester.  No history of HTN or issue with BP thus far this pregnancy but slightly elevates diastolic BP on presentation today.   No HA, vision changes, RUQ, or epigastric pain.     Pregravid weight 104.3 kg Total Weight Gain 15.7 kg  FIRST Problems (from 08/19/17 to present)    Problem Noted Resolved   Maternal obesity, antepartum 11/16/2017 by Vena AustriaStaebler, Lyndsee Casa, MD No   Supervision of high-risk pregnancy 08/19/2017 by Oswaldo ConroySchmid, Jacelyn Y, CNM No   Overview Addendum 01/19/2018  7:05 PM by Natale MilchSchuman, Christanna R, MD    Clinic [facility] Prenatal Labs  Dating 5 week US Blood type: A/Negative/-- (01/30 0953)   Genetic Screen NIPS: Negative, XY Antibody:Negative (01/30 0953)  Anatomic US Normal female Rubella: <0.90 (01/30 0953) Varicella: Immune  GTT Early:73 Third trimester: 119 RPR: Non Reactive (01/30 0953)   Rhogam 12/15/17 HBsAg: Negative (01/30 0953)   TDaP vaccine 06/25/19Flu Shot: 09/18/17 HIV: Non Reactive (01/30 0953)   Baby Food  Breast                              GBS: Negative  Contraception  IUD Pap: Needs postpartum  Support Person Reuel BoomDaniel             BMI 45.0-49.9, adult (HCC) 08/19/2017 by Oswaldo ConroySchmid, Jacelyn Y, CNM No       Review of Systems: 10 point review of systems negative unless otherwise noted in HPI  Past Medical History: Past Medical History:  Diagnosis Date  . Deaf, left   . Medical history non-contributory     Past Surgical History: Past Surgical History:  Procedure Laterality Date  . NO PAST SURGERIES      Past Obstetric History: #: 1, Date: None,  Sex: None, Weight: None, GA: None, Delivery: None, Apgar1: None, Apgar5: None, Living: None, Birth Comments: None   Past Gynecologic History:  Family History: No family history on file.  Social History: Social History   Socioeconomic History  . Marital status: Single    Spouse name: Not on file  . Number of children: Not on file  . Years of education: Not on file  . Highest education level: Not on file  Occupational History  . Not on file  Social Needs  . Financial resource strain: Not on file  . Food insecurity:    Worry: Not on file    Inability: Not on file  . Transportation needs:    Medical: Not on file    Non-medical: Not on file  Tobacco Use  . Smoking status: Never Smoker  . Smokeless tobacco: Never Used  Substance and Sexual Activity  . Alcohol use: No  . Drug use: No  . Sexual activity: Yes    Comment: Not in the last 24 hours  Lifestyle  . Physical activity:    Days per week: Not on file    Minutes per session: Not on file  . Stress: Not on file  Relationships  . Social connections:    Talks on phone: Not on file  Gets together: Not on file    Attends religious service: Not on file    Active member of club or organization: Not on file    Attends meetings of clubs or organizations: Not on file    Relationship status: Not on file  . Intimate partner violence:    Fear of current or ex partner: Not on file    Emotionally abused: Not on file    Physically abused: Not on file    Forced sexual activity: Not on file  Other Topics Concern  . Not on file  Social History Narrative  . Not on file    Medications: Prior to Admission medications   Medication Sig Start Date End Date Taking? Authorizing Provider  prenatal vitamin w/FE, FA (PRENATAL 1 + 1) 27-1 MG TABS tablet Take 1 tablet by mouth daily at 12 noon.   Yes [provider]    Allergies: Allergies  Allergen Reactions  . Ibuprofen Swelling  . Kiwi Extract     Swollen and itchy     Physical Exam: Vitals: Blood pressure (!) 136/92, pulse 92, temperature 97.7 F (36.5 C), temperature source Oral, resp. rate 16, height 5\' 3"  (1.6 m), weight 120 kg, last menstrual period 05/04/2017.  Urine Dip Protein: P/C ratio pending  FHT: 135, moderate, +accels, no decels Toco: q3-72min  General: NAD HEENT: normocephalic, anicteric Pulmonary: No increased work of breathing Cardiovascular: RRR, distal pulses 2+ Abdomen: Gravid, non-tender Leopolds: vtx Genitourinary: Dilation: 1.5 Effacement (%): 70 Cervical Position: Posterior Station: -3 Presentation: Vertex Exam by:: K Veal, RN  Extremities: no edema, erythema, or tenderness Neurologic: Grossly intact Psychiatric: mood appropriate, affect full  Labs: No results found for this or any previous visit (from the past 24 hour(s)).  Assessment: 27 y.o. G1P0 [redacted]w[redacted]d by 03/15/2018, by Ultrasound presenting with ruptured membranes  Plan: 1) Ruptured membranes - observe for cervical change, augment with pitocin if necessary  2) Fetus - category II tracing, will monitor closely given subtle lates noted but with accels present - Growth scan 02/15/2017 6lbs 1oz or 2745g c/w 42nd percentile - Weight gain this pregnancy 35lbs - 1-hr OGTT 73 and 119  3) PNL - Blood type A/Negative/-- (01/30 0953) / Anti-bodyscreen Negative (05/28 1007) / Rubella <0.90 (01/30 0953) / Varicella Immune / RPR Non Reactive (05/28 1007) / HBsAg Negative (01/30 0953) / HIV Non Reactive (05/28 1007) / 1-hr OGTT 119 / GBS Negative (07/29 1646)  4) Immunization History -  Immunization History  Administered Date(s) Administered  . Influenza,inj,Quad PF,6+ Mos 09/18/2017  . Tdap 01/12/2018    5) Elevated BP - preeclampsia labs sent, if remains asymptomatic with normal labs and BP remains mild range no further interventions warranted  6) Disposition - pending delivery  Vena Austria, MD, [facility], [facility] 03/12/2018,  3:10 AM

## 2018-03-12 NOTE — Discharge Instructions (Signed)
°Vaginal Delivery, Care After °Refer to this sheet in the next few weeks. These discharge instructions provide you with information on caring for yourself after delivery. Your caregiver may also give you specific instructions. Your treatment has been planned according to the most current medical practices available, but problems sometimes occur. Call your caregiver if you have any problems or questions after you go home. °HOME CARE INSTRUCTIONS °1. Take over-the-counter or prescription medicines only as directed by your caregiver or pharmacist. °2. Do not drink alcohol, especially if you are breastfeeding or taking medicine to relieve pain. °3. Do not smoke tobacco. °4. Continue to use good perineal care. Good perineal care includes: °1. Wiping your perineum from back to front °2. Keeping your perineum clean. °3. You can do sitz baths twice a day, to help keep this area clean °5. Do not use tampons, douche or have sex for 6 weeks °6. Shower only and avoid sitting in submerged water, aside from sitz baths °7. Wear a well-fitting bra that provides breast support. °8. Eat healthy foods. °9. Drink enough fluids to keep your urine clear or pale yellow. °10. Eat high-fiber foods such as whole grain cereals and breads, brown rice, beans, and fresh fruits and vegetables every day. These foods may help prevent or relieve constipation. °11. Avoid constipation with high fiber foods or medications, such as miralax or metamucil °12. Follow your caregiver's recommendations regarding resumption of activities such as climbing stairs, driving, lifting, exercising, or traveling. °13. Talk to your caregiver about resuming sexual activities. Resumption of sexual activities after 6 weeks is dependent upon your risk of infection, your rate of healing, and your comfort and desire to resume sexual activity. °14. Try to have someone help you with your household activities and your newborn for at least a few days after you leave the  hospital. °15. Rest as much as possible. Try to rest or take a nap when your newborn is sleeping. °16. Increase your activities gradually. °17. Keep all of your scheduled postpartum appointments. It is very important to keep your scheduled follow-up appointments. At these appointments, your caregiver will be checking to make sure that you are healing physically and emotionally. °SEEK MEDICAL CARE IF:  °· You are passing large clots from your vagina. Save any clots to show your caregiver. °· You have a foul smelling discharge from your vagina. °· You have trouble urinating. °· You are urinating frequently. °· You have pain when you urinate. °· You have a change in your bowel movements. °· You have increasing redness, pain, or swelling near your vaginal incision (episiotomy) or vaginal tear. °· You have pus draining from your episiotomy or vaginal tear. °· Your episiotomy or vaginal tear is separating. °· You have painful, hard, or reddened breasts. °· You have a severe headache. °· You have blurred vision or see spots. °· You feel sad or depressed. °· You have thoughts of hurting yourself or your newborn. °· You have questions about your care, the care of your newborn, or medicines. °· You are dizzy or light-headed. °· You have a rash. °· You have nausea or vomiting. °· You were breastfeeding and have not had a menstrual period within 12 weeks after you stopped breastfeeding. °· You are not breastfeeding and have not had a menstrual period by the 12th week after delivery. °· You have a fever of 100.5 or more °SEEK IMMEDIATE MEDICAL CARE IF:  °· You have persistent pain. °· You have chest pain. °· You have shortness   of breath. °· You faint. °· You have leg pain. °· You have stomach pain. °· Your vaginal bleeding saturates two or more sanitary pads in 1 hour. °MAKE SURE YOU:  °· Understand these instructions. °· Will watch your condition. °· Will get help right away if you are not doing well or get worse. °Document  Released: 07/04/2000 Document Revised: 11/21/2013 Document Reviewed: 03/03/2012 °ExitCare® Patient Information ©2015 [employer]. This information is not intended to replace advice given to you by your health care provider. Make sure you discuss any questions you have with your health care provider. ° °Sitz Bath °A sitz bath is a warm water bath taken in the sitting position. The water covers only the hips and butt (buttocks). We recommend using one that fits in the toilet, to help with ease of use and cleanliness. It may be used for either healing or cleaning purposes. Sitz baths are also used to relieve pain, itching, or muscle tightening (spasms). The water may contain medicine. Moist heat will help you heal and relax.  °HOME CARE  °Take 3 to 4 sitz baths a day. °18. Fill the bathtub half-full with warm water. °19. Sit in the water and open the drain a little. °20. Turn on the warm water to keep the tub half-full. Keep the water running constantly. °21. Soak in the water for 15 to 20 minutes. °22. After the sitz bath, pat the affected area dry. °GET HELP RIGHT AWAY IF: °You get worse instead of better. Stop the sitz baths if you get worse. °MAKE SURE YOU: °· Understand these instructions. °· Will watch your condition. °· Will get help right away if you are not doing well or get worse. °Document Released: 08/14/2004 Document Revised: 03/31/2012 Document Reviewed: 11/04/2010 °ExitCare® Patient Information ©2015 [employer]. This information is not intended to replace advice given to you by your health care provider. Make sure you discuss any questions you have with your health care provider. ° ° °

## 2018-03-12 NOTE — Progress Notes (Signed)
L&D Progress Note  27 year old G1 P0 presented this Am at 39wk4d with onset of labor and SROM at 0100 (clear fluid). Progressed to 4 cm and received an epidural. Had some mild range blood pressures in labor with normal PIH labs  S: Comfortable  O: BP (!) 107/51   Pulse (!) 101   Temp 97.7 F (36.5 C) (Oral)   Resp 16   Ht 5\' 3"  (1.6 m)   Wt 120 kg   LMP 05/04/2017 (Exact Date)   SpO2 98%   BMI 46.86 kg/m   General: appears comfortable  FHR: baseline 145 with accelerations to 160s to 170, moderate variability with occasional variable deceleration.  Toco: contractions every 2-3 minutes apart  Cervix: 6/90%/-1/ ?LOP  A: IUP at 39wk4d progressing in labor Reassuring FHR overall.  P: Turn and use peanut ball to try to get baby to turn.  Monitor progress and fetal/ maternal well being.   Alexis Horne, CNM

## 2018-03-13 LAB — CBC
HEMATOCRIT: 32.3 % — AB (ref 35.0–47.0)
HEMOGLOBIN: 11.3 g/dL — AB (ref 12.0–16.0)
MCH: 33 pg (ref 26.0–34.0)
MCHC: 35 g/dL (ref 32.0–36.0)
MCV: 94.4 fL (ref 80.0–100.0)
Platelets: 166 10*3/uL (ref 150–440)
RBC: 3.42 MIL/uL — ABNORMAL LOW (ref 3.80–5.20)
RDW: 13.1 % (ref 11.5–14.5)
WBC: 12.4 10*3/uL — AB (ref 3.6–11.0)

## 2018-03-13 LAB — FETAL SCREEN: Fetal Screen: NEGATIVE

## 2018-03-13 LAB — RPR: RPR: NONREACTIVE

## 2018-03-13 MED ORDER — RHO D IMMUNE GLOBULIN 1500 UNIT/2ML IJ SOSY
300.0000 ug | PREFILLED_SYRINGE | Freq: Once | INTRAMUSCULAR | Status: AC
  Administered 2018-03-13: 300 ug via INTRAMUSCULAR
  Filled 2018-03-13: qty 2

## 2018-03-13 NOTE — Progress Notes (Addendum)
C/O Nausea and Indigestion. Zofran P.O. Per PRN order and Simethicone per PRN order. Will follow for relief of

## 2018-03-13 NOTE — Progress Notes (Signed)
Post Partum Day 1 Subjective: Having low back/buttocks pain. She is requesting narcotic pain medicine for the pain.  Tolerating regular diet. She is voiding and ambulating without difficulty. She would like to breastfeed but doesn't think anything is coming out so she is giving the baby formula also. Encouraged to continue putting baby to breast.   No CP SOB F/C N/V or leg pain No HA, change of vision, RUQ/epigastric pain  Objective: BP 136/85 (BP Location: Left Arm)   Pulse (!) 116 Comment: nurse Romelle Starcher notified  Temp 98.6 F (37 C) (Oral)   Resp 20   Ht _0  (1.6 m)   Wt 120 kg   LMP 05/04/2017 (Exact Date)   SpO2 98%   Breastfeeding   BMI 46.86 kg/m    Physical Exam:  General: NAD CV: RRR Pulm: nl effort, CTABL Lochia: moderate Uterine Fundus: fundus firm and below umbilicus DVT Evaluation: no cords, ttp LEs   Recent Labs    03/12/18 0235 03/13/18 0507  HGB 14.4 11.3*  HCT 40.8 32.3*  WBC 11.0 12.4*  PLT 199 166    Assessment/Plan: 26 y.o. G1P1001 postpartum day # 1  1. Continue routine postpartum care 2. A negative- Rhogam indicated/baby is O positive 3. Rubella Non-immune: will accept MMR prior to discharge 4. Varicella Immune 5. TDAP given antepartum 6. Breastfeeding/Formula Feeding 7. Contraception: Mirena   Rod Can, CNM [facility], [facility]

## 2018-03-14 MED ORDER — MEASLES, MUMPS & RUBELLA VAC ~~LOC~~ INJ
0.5000 mL | INJECTION | Freq: Once | SUBCUTANEOUS | Status: AC
  Administered 2018-03-14: 0.5 mL via SUBCUTANEOUS
  Filled 2018-03-14 (×2): qty 0.5

## 2018-03-14 MED ORDER — OXYCODONE HCL 5 MG PO TABS: 5.0000 mg | ORAL_TABLET | Freq: Four times a day (QID) | ORAL | 0 refills | Status: AC | PRN

## 2018-03-14 NOTE — Progress Notes (Addendum)
Discharge instructions given. Patient verbalizes understanding of teaching. Patient discharged home via wheelchair at 1315. 

## 2018-03-15 LAB — RHOGAM INJECTION: Unit division: 0

## 2018-03-17 NOTE — Anesthesia Postprocedure Evaluation (Signed)
Anesthesia Post Note  Patient: Alexis Horne  Procedure(s) Performed: AN AD HOC LABOR EPIDURAL  Patient location during evaluation: Mother Baby Anesthesia Type: Epidural Level of consciousness: awake and alert and oriented Pain management: pain level controlled Vital Signs Assessment: post-procedure vital signs reviewed and stable Respiratory status: spontaneous breathing Cardiovascular status: blood pressure returned to baseline Anesthetic complications: no     Last Vitals: There were no vitals filed for this visit.  Last Pain: There were no vitals filed for this visit.               Kacper Cartlidge

## 2018-03-18 ENCOUNTER — Emergency Department: Payer: Medicaid Other

## 2018-03-18 ENCOUNTER — Encounter: Payer: Self-pay | Admitting: Maternal Newborn

## 2018-03-18 ENCOUNTER — Encounter: Payer: Self-pay | Admitting: *Deleted

## 2018-03-18 ENCOUNTER — Other Ambulatory Visit: Payer: Self-pay

## 2018-03-18 ENCOUNTER — Other Ambulatory Visit (HOSPITAL_COMMUNITY)
Admission: RE | Admit: 2018-03-18 | Discharge: 2018-03-18 | Disposition: A | Discharge status: Home / Self Care | Payer: Medicaid Other | Source: Ambulatory Visit | Attending: Maternal Newborn | Admitting: Maternal Newborn

## 2018-03-18 ENCOUNTER — Ambulatory Visit (INDEPENDENT_AMBULATORY_CARE_PROVIDER_SITE_OTHER): Payer: Medicaid Other | Admitting: Maternal Newborn

## 2018-03-18 VITALS — BP 140/80 | HR 66 | Ht 63.0 in | Wt 254.5 lb

## 2018-03-18 DIAGNOSIS — R0602 Shortness of breath: Secondary | ICD-10-CM | POA: Diagnosis not present

## 2018-03-18 DIAGNOSIS — R079 Chest pain, unspecified: Secondary | ICD-10-CM | POA: Diagnosis present

## 2018-03-18 DIAGNOSIS — Z79899 Other long term (current) drug therapy: Secondary | ICD-10-CM | POA: Insufficient documentation

## 2018-03-18 DIAGNOSIS — O9089 Other complications of the puerperium, not elsewhere classified: Secondary | ICD-10-CM

## 2018-03-18 DIAGNOSIS — N898 Other specified noninflammatory disorders of vagina: Secondary | ICD-10-CM | POA: Insufficient documentation

## 2018-03-18 DIAGNOSIS — R51 Headache: Secondary | ICD-10-CM | POA: Diagnosis not present

## 2018-03-18 DIAGNOSIS — R102 Pelvic and perineal pain: Secondary | ICD-10-CM

## 2018-03-18 LAB — BASIC METABOLIC PANEL
Anion gap: 7 (ref 5–15)
BUN: 10 mg/dL (ref 6–20)
CALCIUM: 8.8 mg/dL — AB (ref 8.9–10.3)
CHLORIDE: 110 mmol/L (ref 98–111)
CO2: 23 mmol/L (ref 22–32)
Creatinine, Ser: 0.61 mg/dL (ref 0.44–1.00)
GFR calc Af Amer: >60 mL/min (ref >60)
GFR calc non Af Amer: >60 mL/min (ref >60)
GLUCOSE: 100 mg/dL — AB (ref 70–99)
Potassium: 3.6 mmol/L (ref 3.5–5.1)
Sodium: 140 mmol/L (ref 135–145)

## 2018-03-18 LAB — CBC
HEMATOCRIT: 32.5 % — AB (ref 35.0–47.0)
Hemoglobin: 11.5 g/dL — ABNORMAL LOW (ref 12.0–16.0)
MCH: 33.3 pg (ref 26.0–34.0)
MCHC: 35.5 g/dL (ref 32.0–36.0)
MCV: 94 fL (ref 80.0–100.0)
Platelets: 292 10*3/uL (ref 150–440)
RBC: 3.46 MIL/uL — ABNORMAL LOW (ref 3.80–5.20)
RDW: 12.9 % (ref 11.5–14.5)
WBC: 8.7 10*3/uL (ref 3.6–11.0)

## 2018-03-18 LAB — TROPONIN I

## 2018-03-18 NOTE — ED Triage Notes (Signed)
Pt ambulatory to triage. Pt has left side chest pain.  Pt is one week post partum vaginal delivery.  Pt also reports intermittent sob.  Pt has a headache.  Pt alert  Speech clear.

## 2018-03-18 NOTE — Progress Notes (Signed)
Postpartum Visit  Chief Complaint:  Chief Complaint  Patient presents with  . Postpartum Care    headaches; stitches very painful- wants to make sure no inf.; breasts are very sore - no milk production    History of Present Illness: Patient is a 27 y.o. G1P1001 presenting for a postpartum visit.  Date of delivery: 03/12/2018 Type of delivery: Vaginal delivery - Vacuum or forceps assisted  no Episiotomy: No. Laceration: yes  :  Breast Feeding:  pumping Post partum depression/anxiety noted:  no Edinburgh Post-Partum Depression Score:  0   Review of Systems  Constitutional: Negative.   HENT: Negative.   Eyes: Negative.   Respiratory: Negative.   Cardiovascular: Negative.   Gastrointestinal: Negative.   Genitourinary:       Pain at site of perineal laceration  Musculoskeletal: Negative.   Skin: Negative.   Neurological: Positive for headaches.  Psychiatric/Behavioral: Negative for depression.  Breast ROS: positive for - bilateral tenderness   Review of systems otherwise negative unless noted in HPI.  Past Medical History:  Past Medical History:  Diagnosis Date  . Deaf, left   . Medical history non-contributory     Past Surgical History:  Past Surgical History:  Procedure Laterality Date  . NO PAST SURGERIES      Family History:  Family History  Problem Relation Age of Onset  . Hypertension Mother   . Diabetes Mother   . Cancer Father 30       colon  . Congestive Heart Failure Father   . Hypertension Father   . Diabetes Father     Social History:  Social History   Socioeconomic History  . Marital status: Single    Spouse name: Not on file  . Number of children: 1  . Years of education: 17  . Highest education level: Not on file  Occupational History  . Occupation: STAY AT HOME MOM  Social Needs  . Financial resource strain: Not on file  . Food insecurity:    Worry: Not on file    Inability: Not on file  . Transportation needs:    Medical: Not  on file    Non-medical: Not on file  Tobacco Use  . Smoking status: Never Smoker  . Smokeless tobacco: Never Used  Substance and Sexual Activity  . Alcohol use: No  . Drug use: No  . Sexual activity: Not Currently    Comment: Not in the last 24 hours  Lifestyle  . Physical activity:    Days per week: Not on file    Minutes per session: Not on file  . Stress: Not on file  Relationships  . Social connections:    Talks on phone: Not on file    Gets together: Not on file    Attends religious service: Not on file    Active member of club or organization: Not on file    Attends meetings of clubs or organizations: Not on file    Relationship status: Not on file  . Intimate partner violence:    Fear of current or ex partner: Not on file    Emotionally abused: Not on file    Physically abused: Not on file    Forced sexual activity: Not on file  Other Topics Concern  . Not on file  Social History Narrative  . Not on file    Allergies:  Allergies  Allergen Reactions  . Ibuprofen Swelling  . Kiwi Extract     Swollen and itchy  Medications: Prior to Admission medications   Medication Sig Start Date End Date Taking? Authorizing Provider  docusate sodium (COLACE) 100 MG capsule Take 100 mg by mouth daily.   Yes [provider]  oxyCODONE-acetaminophen (PERCOCET/ROXICET) 5-325 MG tablet Take 1 tablet by mouth every 4 (four) hours as needed for severe pain.   Yes [provider]  prenatal vitamin w/FE, FA (PRENATAL 1 + 1) 27-1 MG TABS tablet Take 1 tablet by mouth daily at 12 noon.   Yes [provider]    Physical Exam Vitals:  Vitals:   03/18/18 1358  BP: 140/80  Pulse: 66    General: NAD HEENT: normocephalic, anicteric Pulmonary: No increased work of breathing Breasts: Soft, normal breast tissue, no lumps or reddened areas Abdomen: Soft, non-tender, non-distended.   Genitourinary:  External: Normal external female genitalia.  Normal  urethral meatus, normal  Bartholin's and Skene's glands.    Vagina: Normal vaginal mucosa, no evidence of prolapse. Well approximated  perineal laceration without redness, discharge, swelling, or hematoma.    Cervix: deferred Extremities: trace edema, no erythema or tenderness Neurologic: Grossly intact Psychiatric: mood appropriate, affect full  Assessment: 27 y.o. G1P1001 presenting for a postpartum visit.  Plan: Problem List Items Addressed This Visit    None    Visit Diagnoses    Postpartum perineal pain    -  Primary   Vaginal odor       Relevant Orders   Cervicovaginal ancillary only   Itching in the vaginal area       Relevant Orders   Cervicovaginal ancillary only     1) Sent swab for odor and itching. Perineal laceration healing well and scant lochia rubra present as appropriate. Discussed that odor and itching may be occurring because of the normal lochia and wound healing wound.   2) Patient underwent screening for postpartum depression with no concerns noted.  3) She is concerned about blood pressure with daily headaches. She will return next week for a BP check. No epigastric pain or visual changes. Recommended Tylenol and caffeine for headaches (she is allergic to ibuprofen).  4) She is breast pumping about three times a day; baby is feeding exclusively by bottle. Breast exam normal with no signs of mastitis or engorgement. Discussed that establishing milk supply while exclusively pumping may be difficult, and talked about pumping more frequently and consulting with lactation.  Marcelyn BruinsJacelyn Schmid, CNM

## 2018-03-19 ENCOUNTER — Emergency Department
Admission: EM | Admit: 2018-03-19 | Discharge: 2018-03-19 | Disposition: A | Discharge status: Home / Self Care | Payer: Medicaid Other | Attending: Emergency Medicine | Admitting: Emergency Medicine

## 2018-03-19 ENCOUNTER — Emergency Department: Payer: Medicaid Other

## 2018-03-19 DIAGNOSIS — R079 Chest pain, unspecified: Secondary | ICD-10-CM

## 2018-03-19 LAB — TROPONIN I

## 2018-03-19 MED ORDER — ONDANSETRON HCL 4 MG/2ML IJ SOLN
4.0000 mg | Freq: Once | INTRAMUSCULAR | Status: AC
  Administered 2018-03-19: 4 mg via INTRAVENOUS

## 2018-03-19 MED ORDER — OXYCODONE-ACETAMINOPHEN 5-325 MG PO TABS: 1.0000 | ORAL_TABLET | ORAL | 0 refills | Status: DC | PRN

## 2018-03-19 MED ORDER — OXYCODONE-ACETAMINOPHEN 5-325 MG PO TABS
1.0000 | ORAL_TABLET | Freq: Once | ORAL | Status: AC
  Administered 2018-03-19: 1 via ORAL
  Filled 2018-03-19: qty 1

## 2018-03-19 MED ORDER — SODIUM CHLORIDE 0.9 % IV BOLUS
1000.0000 mL | Freq: Once | INTRAVENOUS | Status: AC
  Administered 2018-03-19: 1000 mL via INTRAVENOUS

## 2018-03-19 MED ORDER — IOPAMIDOL (ISOVUE-370) INJECTION 76%
75.0000 mL | Freq: Once | INTRAVENOUS | Status: AC | PRN
  Administered 2018-03-19: 75 mL via INTRAVENOUS

## 2018-03-19 MED ORDER — FENTANYL CITRATE (PF) 100 MCG/2ML IJ SOLN
INTRAMUSCULAR | Status: AC
  Filled 2018-03-19: qty 2

## 2018-03-19 MED ORDER — FENTANYL CITRATE (PF) 100 MCG/2ML IJ SOLN
50.0000 ug | Freq: Once | INTRAMUSCULAR | Status: AC
  Administered 2018-03-19: 50 ug via INTRAVENOUS

## 2018-03-19 MED ORDER — ONDANSETRON HCL 4 MG/2ML IJ SOLN
INTRAMUSCULAR | Status: AC
  Filled 2018-03-19: qty 2

## 2018-03-19 NOTE — ED Provider Notes (Signed)
[facility] Emergency Department Provider Note   ____________________________________________   First MD Initiated Contact with Patient 03/19/18 0041     (approximate)  I have reviewed the triage vital signs and the nursing notes.   HISTORY  Chief Complaint Chest Pain and Migraine    HPI Alexis Horne is a 27 y.o. female who presents to the ED from home with a chief complaint of chest pain and headache.  Patient is 1 week postpartum NSVD, not breast-feeding.  Tonight developed onset of left-sided chest pain which she describes as nonradiating and feels like someone is punching her in the chest.  Symptoms associated with shortness of breath.  Patient also describes mild, gradual onset global headache associated with nausea, no vomiting.  Denies recent fever, chills, cough, congestion, abdominal pain, dysuria, diarrhea.  Denies recent travel or trauma.   Past Medical History:  Diagnosis Date  . Deaf, left   . Medical history non-contributory     Patient Active Problem List   Diagnosis Date Noted  . Postpartum care following vaginal delivery 03/12/2018  . Supervision of high-risk pregnancy 08/19/2017  . BMI 45.0-49.9, adult (HCC) 08/19/2017    Past Surgical History:  Procedure Laterality Date  . NO PAST SURGERIES      Prior to Admission medications   Medication Sig Start Date End Date Taking? Authorizing Provider  docusate sodium (COLACE) 100 MG capsule Take 100 mg by mouth daily.   Yes [provider]  oxyCODONE-acetaminophen (PERCOCET/ROXICET) 5-325 MG tablet Take 1 tablet by mouth every 4 (four) hours as needed for severe pain.   Yes [provider]  prenatal vitamin w/FE, FA (PRENATAL 1 + 1) 27-1 MG TABS tablet Take 1 tablet by mouth daily at 12 noon.   Yes [provider]    Allergies Ibuprofen and Kiwi extract  Family History  Problem Relation Age of Onset  . Hypertension Mother   . Diabetes Mother   .  Cancer Father [id number]       colon  . Congestive Heart Failure Father   . Hypertension Father   . Diabetes Father     Social History Social History   Tobacco Use  . Smoking status: Never Smoker  . Smokeless tobacco: Never Used  Substance Use Topics  . Alcohol use: No  . Drug use: No    Review of Systems  Constitutional: No fever/chills Eyes: No visual changes. ENT: No sore throat. Cardiovascular: Positive for chest pain. Respiratory: Positive for shortness of breath. Gastrointestinal: No abdominal pain.  Positive for nausea, no vomiting.  No diarrhea.  No constipation. Genitourinary: Negative for dysuria. Musculoskeletal: Negative for back pain. Skin: Negative for rash. Neurological: Negative for headaches, focal weakness or numbness.   ____________________________________________   PHYSICAL EXAM:  VITAL SIGNS: ED Triage Vitals  Enc Vitals Group     BP 03/18/18 2113 (!) 142/88     Pulse Rate 03/18/18 2113 86     Resp 03/18/18 2113 18     Temp 03/18/18 2113 98.3 F (36.8 C)     Temp Source 03/18/18 2113 Oral     SpO2 03/18/18 2113 99 %     Weight 03/18/18 2111 254 lb (115.2 kg)     Height 03/18/18 2111 5\' 3"  (1.6 m)     Head Circumference --      Peak Flow --      Pain Score 03/18/18 2111 7     Pain Loc --      Pain  Edu? --      Excl. in GC? --     Constitutional: Alert and oriented. Well appearing and in mild acute distress. Eyes: Conjunctivae are normal. PERRL. EOMI. Head: Atraumatic. Nose: No congestion/rhinnorhea. Mouth/Throat: Mucous membranes are moist.  Oropharynx non-erythematous. Neck: No stridor.  No carotid bruits. Cardiovascular: Normal rate, regular rhythm. Grossly normal heart sounds.  Good peripheral circulation. Respiratory: Normal respiratory effort.  No retractions. Lungs CTAB. Gastrointestinal: Soft and nontender. No distention. No abdominal bruits. No CVA tenderness. Musculoskeletal: No lower extremity tenderness nor edema.  No joint  effusions. Neurologic:  Normal speech and language. No gross focal neurologic deficits are appreciated. No gait instability. Skin:  Skin is warm, dry and intact. No rash noted.  Breasts are not warm or erythematous. Psychiatric: Mood and affect are normal. Speech and behavior are normal.  ____________________________________________   LABS (all labs ordered are listed, but only abnormal results are displayed)  Labs Reviewed  BASIC METABOLIC PANEL - Abnormal; Notable for the following components:      Result Value   Glucose, Bld 100 (*)    Calcium 8.8 (*)    All other components within normal limits  CBC - Abnormal; Notable for the following components:   RBC 3.46 (*)    Hemoglobin 11.5 (*)    HCT 32.5 (*)    All other components within normal limits  TROPONIN I  TROPONIN I   ____________________________________________  EKG  ED ECG REPORT I, Emmajean Ratledge J, the attending physician, personally viewed and interpreted this ECG.   Date: 03/19/2018  EKG Time: 2113  Rate: 90  Rhythm: normal EKG, normal sinus rhythm  Axis: Normal  Intervals:none  ST&T Change: Nonspecific  ____________________________________________  RADIOLOGY  ED MD interpretation: No acute cardiopulmonary process  Official radiology report(s): Dg Chest 2 View  Result Date: 03/18/2018 CLINICAL DATA:  Left side chest pain EXAM: CHEST - 2 VIEW COMPARISON:  02/16/2017 FINDINGS: Heart and mediastinal contours are within normal limits. No focal opacities or effusions. No acute bony abnormality. IMPRESSION: No active cardiopulmonary disease. Electronically Signed   By: Charlett Nose M.D.   On: 03/18/2018 21:33   Ct Head Wo Contrast  Result Date: 03/19/2018 CLINICAL DATA:  27 y/o F; left-sided chest pain, intermittent shortness of breath headache. EXAM: CT HEAD WITHOUT CONTRAST TECHNIQUE: Contiguous axial images were obtained from the base of the skull through the vertex without intravenous contrast. COMPARISON:   None. FINDINGS: Brain: No evidence of acute infarction, hemorrhage, hydrocephalus, extra-axial collection or mass lesion/mass effect. Vascular: No hyperdense vessel or unexpected calcification. Skull: Normal. Negative for fracture or focal lesion. Sinuses/Orbits: No acute finding. Other: None. IMPRESSION: Negative CT of the head. Electronically Signed   By: Mitzi Hansen M.D.   On: 03/19/2018 01:54   Ct Angio Chest Pe W/cm &/or Wo Cm  Result Date: 03/19/2018 CLINICAL DATA:  27 y/o F; left-sided chest pain. One week postpartum vaginal delivery. Intermittent shortness of breath. EXAM: CT ANGIOGRAPHY CHEST WITH CONTRAST TECHNIQUE: Multidetector CT imaging of the chest was performed using the standard protocol during bolus administration of intravenous contrast. Multiplanar CT image reconstructions and MIPs were obtained to evaluate the vascular anatomy. CONTRAST:  75mL ISOVUE-370 IOPAMIDOL (ISOVUE-370) INJECTION 76% COMPARISON:  None. FINDINGS: Cardiovascular: Satisfactory opacification of the pulmonary arteries to the segmental level. No evidence of pulmonary embolism. Mildly enlarged heart size. No pericardial effusion. Mediastinum/Nodes: No enlarged mediastinal, hilar, or axillary lymph nodes. Thyroid gland, trachea, and esophagus demonstrate no significant findings. Lungs/Pleura: Lungs are clear.  No pleural effusion or pneumothorax. Upper Abdomen: No acute abnormality. Spleen measures 14.6 x 5.3 x 9.1 cm (volume = 370 cm^3). Musculoskeletal: Mild discogenic degenerative changes of the thoracic spine with vacuum phenomenon and loss of intervertebral disc space height. Review of the MIP images confirms the above findings. IMPRESSION: 1. No acute pulmonary embolus identified. 2. Mild enlarged heart size and spleen, possibly related to recent hyperdynamic state of pregnancy. Electronically Signed   By: Mitzi Hansen M.D.   On: 03/19/2018 02:04     ____________________________________________   PROCEDURES  Procedure(s) performed: None  Procedures  Critical Care performed: No  ____________________________________________   INITIAL IMPRESSION / ASSESSMENT AND PLAN / ED COURSE  As part of my medical decision making, I reviewed the following data within the electronic MEDICAL RECORD NUMBER Nursing notes reviewed and incorporated, Labs reviewed, EKG interpreted, Old chart reviewed, Radiograph reviewed and Notes from prior ED visits   27 year old female 1 week postpartum who presents with chest pain and shortness of breath. Differential diagnosis includes, but is not limited to, ACS, aortic dissection, pulmonary embolism, cardiac tamponade, pneumothorax, pneumonia, pericarditis, myocarditis, GI-related causes including esophagitis/gastritis, and musculoskeletal chest wall pain.    Initial EKG, troponin and chest x-ray unremarkable.  Given patient's recent postpartum status, will proceed directly to CTA chest to evaluate for PE.  Given her headache, will obtain noncontrast CT head.  Will repeat timed troponin.  50 mcg IV fentanyl will be given for pain paired with 4 mg IV Zofran for nausea.  Clinical Course as of Mar 19 233  Fri Mar 19, 2018  [id number] Patient feeling significantly better.  Updated her on CT and repeat troponin results. Will discharge home with prescription for Percocet and she will follow up closely with her PCP.  Strict return precautions given.  Patient verbalizes understanding and agrees with plan of care.   [JS]    Clinical Course User Index [JS] Irean Hong, MD     ____________________________________________   FINAL CLINICAL IMPRESSION(S) / ED DIAGNOSES  Final diagnoses:  Chest pain, unspecified type     ED Discharge Orders    None       Note:  This document was prepared using Dragon voice recognition software and may include unintentional dictation errors.    Irean Hong, MD 03/19/18  [phone]

## 2018-03-19 NOTE — Discharge Instructions (Addendum)
1.  You may take Percocet as needed for pain. 2.  Apply moist heat to affected area several times daily. 3.  Return to the ER for worsening symptoms, persistent vomiting, difficulty breathing or other concerns. 

## 2018-03-22 ENCOUNTER — Encounter: Payer: Self-pay | Admitting: Maternal Newborn

## 2018-03-23 ENCOUNTER — Other Ambulatory Visit: Payer: Self-pay | Admitting: Maternal Newborn

## 2018-03-23 DIAGNOSIS — N760 Acute vaginitis: Principal | ICD-10-CM

## 2018-03-23 DIAGNOSIS — B9689 Other specified bacterial agents as the cause of diseases classified elsewhere: Secondary | ICD-10-CM

## 2018-03-23 LAB — CERVICOVAGINAL ANCILLARY ONLY
BACTERIAL VAGINITIS: POSITIVE — AB
CANDIDA VAGINITIS: NEGATIVE

## 2018-03-23 MED ORDER — METRONIDAZOLE 500 MG PO TABS: 500.0000 mg | ORAL_TABLET | Freq: Two times a day (BID) | ORAL | 0 refills | Status: AC

## 2018-03-23 NOTE — Progress Notes (Signed)
Rx sent for Flagyl to treat bacterial vaginosis. 

## 2018-03-25 ENCOUNTER — Ambulatory Visit: Payer: Medicaid Other | Admitting: Certified Nurse Midwife

## 2018-03-30 ENCOUNTER — Telehealth: Payer: Self-pay

## 2018-03-30 NOTE — Telephone Encounter (Signed)
Pt had vaginal delivery on 8/23, with a laceration. She was seen by Marcelyn Bruins on 8/29 for possible BV/B/P check. She is calling triage line wondering when she can take a normal bath and clean herself on a regular basis?   She did not show for follow up B/P check on 9/4 with CLG.   I spoke to patient, she states no vaginal issues. Laceration area looks good and has no signs of soreness/infection. She states the abx JS gave her worked well.  Pt aware she may take normal baths/shower. Advised not to scrub to hard and to only pat dry. Pt aware to call back if she notices any issues. I reminded her of her pp visit in October. Pt voiced understanding. KJ CMA

## 2018-04-05 ENCOUNTER — Encounter: Payer: Self-pay | Admitting: Emergency Medicine

## 2018-04-05 ENCOUNTER — Other Ambulatory Visit: Payer: Self-pay

## 2018-04-05 ENCOUNTER — Emergency Department
Admission: EM | Admit: 2018-04-05 | Discharge: 2018-04-05 | Disposition: A | Discharge status: Home / Self Care | Payer: Medicaid Other | Attending: Emergency Medicine | Admitting: Emergency Medicine

## 2018-04-05 DIAGNOSIS — B9689 Other specified bacterial agents as the cause of diseases classified elsewhere: Secondary | ICD-10-CM

## 2018-04-05 DIAGNOSIS — N760 Acute vaginitis: Secondary | ICD-10-CM | POA: Insufficient documentation

## 2018-04-05 DIAGNOSIS — N390 Urinary tract infection, site not specified: Secondary | ICD-10-CM | POA: Diagnosis not present

## 2018-04-05 DIAGNOSIS — R350 Frequency of micturition: Secondary | ICD-10-CM | POA: Diagnosis present

## 2018-04-05 LAB — WET PREP, GENITAL
Sperm: NONE SEEN
Trich, Wet Prep: NONE SEEN
YEAST WET PREP: NONE SEEN

## 2018-04-05 LAB — URINALYSIS, COMPLETE (UACMP) WITH MICROSCOPIC
Bilirubin Urine: NEGATIVE
Glucose, UA: NEGATIVE mg/dL
KETONES UR: NEGATIVE mg/dL
Nitrite: NEGATIVE
PROTEIN: 100 mg/dL — AB
Specific Gravity, Urine: 1.018 (ref 1.005–1.030)
pH: 6 (ref 5.0–8.0)

## 2018-04-05 MED ORDER — FLUCONAZOLE 150 MG PO TABS: ORAL_TABLET | ORAL | 0 refills | Status: DC

## 2018-04-05 MED ORDER — METRONIDAZOLE 500 MG PO TABS: 500.0000 mg | ORAL_TABLET | Freq: Three times a day (TID) | ORAL | 0 refills | Status: DC

## 2018-04-05 MED ORDER — CEFDINIR 300 MG PO CAPS: 300.0000 mg | ORAL_CAPSULE | Freq: Two times a day (BID) | ORAL | 0 refills | Status: DC

## 2018-04-05 NOTE — Discharge Instructions (Addendum)
Follow-up with your regular doctor if not improving in 3 days.  Return to the emergency department if you develop fever or abdominal pain.  Take the medication as prescribed.  Be sure to take all of the antibiotic and the Flagyl.  You may use the Diflucan if you feel like you are developing a yeast infection after taking the antibiotic.

## 2018-04-05 NOTE — ED Notes (Signed)
See triage note  States she is 24 days PP  Having some urinary freq and pain over the past few days  Also having some dark discharge

## 2018-04-05 NOTE — ED Provider Notes (Signed)
[facility] Emergency Department Provider Note  ____________________________________________   First MD Initiated Contact with Patient 04/05/18 1449     (approximate)  I have reviewed the triage vital signs and the nursing notes.   HISTORY  Chief Complaint Urinary Frequency    HPI Alexis Horne is a 27 y.o. female presents emergency department complaining of urinary frequency and dysuria for the last few days.  She states she has had some itching and burning in the vaginal area.  She is 24 days postpartum.  She had BV a few weeks ago and was given Flagyl but did not finish the medication as the symptoms stopped.  Now she is having similar symptoms.  She has not started having sex since the vaginal delivery.  She denies any fever or chills or abdominal pain at this time.    Past Medical History:  Diagnosis Date  . Deaf, left   . Medical history non-contributory     Patient Active Problem List   Diagnosis Date Noted  . Postpartum care following vaginal delivery 03/12/2018  . Supervision of high-risk pregnancy 08/19/2017  . BMI 45.0-49.9, adult (HCC) 08/19/2017    Past Surgical History:  Procedure Laterality Date  . NO PAST SURGERIES      Prior to Admission medications   Medication Sig Start Date End Date Taking? Authorizing Provider  cefdinir (OMNICEF) 300 MG capsule Take 1 capsule (300 mg total) by mouth 2 (two) times daily. 04/05/18   Fisher, Roselyn Bering, PA-C  fluconazole (DIFLUCAN) 150 MG tablet Take one now and one in a week 04/05/18   Sherrie Mustache, Roselyn Bering, PA-C  metroNIDAZOLE (FLAGYL) 500 MG tablet Take 1 tablet (500 mg total) by mouth 3 (three) times daily. 04/05/18   Faythe Ghee, PA-C  prenatal vitamin w/FE, FA (PRENATAL 1 + 1) 27-1 MG TABS tablet Take 1 tablet by mouth daily at 12 noon.    [provider]    Allergies Ibuprofen and Kiwi extract  Family History  Problem Relation Age of Onset  . Hypertension Mother   . Diabetes  Mother   . Cancer Father 71       colon  . Congestive Heart Failure Father   . Hypertension Father   . Diabetes Father     Social History Social History   Tobacco Use  . Smoking status: Never Smoker  . Smokeless tobacco: Never Used  Substance Use Topics  . Alcohol use: No  . Drug use: No    Review of Systems  Constitutional: No fever/chills Eyes: No visual changes. ENT: No sore throat. Respiratory: Denies cough Genitourinary: Positive for dysuria and vaginal itching Musculoskeletal: Negative for back pain. Skin: Negative for rash.    ____________________________________________   PHYSICAL EXAM:  VITAL SIGNS: ED Triage Vitals  Enc Vitals Group     BP 04/05/18 1417 105/83     Pulse Rate 04/05/18 1417 90     Resp 04/05/18 1417 18     Temp 04/05/18 1417 98.5 F (36.9 C)     Temp Source 04/05/18 1417 Oral     SpO2 04/05/18 1417 98 %     Weight 04/05/18 1418 250 lb (113.4 kg)     Height 04/05/18 1418 5\' 3"  (1.6 m)     Head Circumference --      Peak Flow --      Pain Score 04/05/18 1418 0     Pain Loc --      Pain Edu? --  Excl. in GC? --     Constitutional: Alert and oriented. Well appearing and in no acute distress. Eyes: Conjunctivae are normal.  Head: Atraumatic. Nose: No congestion/rhinnorhea. Mouth/Throat: Mucous membranes are moist.   Neck:  supple no lymphadenopathy noted Cardiovascular: Normal rate, regular rhythm.  Respiratory: Normal respiratory effort.  No retractions  Abd: soft nontender bs normal all 4 quad GU: External vaginal exam does not show any herpetic lesions or abscesses.  Swabs were obtained for wet prep.  There is a slightly yellow discharge noted at the vaginal vault. Musculoskeletal: FROM all extremities, warm and well perfused Neurologic:  Normal speech and language.  Skin:  Skin is warm, dry and intact. No rash noted. Psychiatric: Mood and affect are normal. Speech and behavior are  normal.  ____________________________________________   LABS (all labs ordered are listed, but only abnormal results are displayed)  Labs Reviewed  WET PREP, GENITAL - Abnormal; Notable for the following components:      Result Value   Clue Cells Wet Prep HPF POC PRESENT (*)    WBC, Wet Prep HPF POC MANY (*)    All other components within normal limits  URINALYSIS, COMPLETE (UACMP) WITH MICROSCOPIC - Abnormal; Notable for the following components:   Color, Urine YELLOW (*)    APPearance TURBID (*)    Hgb urine dipstick MODERATE (*)    Protein, ur 100 (*)    Leukocytes, UA MODERATE (*)    WBC, UA >50 (*)    Bacteria, UA RARE (*)    All other components within normal limits  URINE CULTURE   ____________________________________________   ____________________________________________  RADIOLOGY    ____________________________________________   PROCEDURES  Procedure(s) performed: No  Procedures    ____________________________________________   INITIAL IMPRESSION / ASSESSMENT AND PLAN / ED COURSE  Pertinent labs & imaging results that were available during my care of the patient were reviewed by me and considered in my medical decision making (see chart for details).   Patient is 27 year old female presents emergency department complaining of vaginal itching and burning along with dysuria.  She is 24 days postpartum.  She denies any abdominal pain or fever.  On physical exam patient appears well.  She is afebrile her vitals are normal.  The vaginal exam shows a yellowish discharge with a fishy odor.  Wet prep is positive for WBC and clue cells, UA shows moderate leuks with greater than 50 WBCs and rare bacteria  Discussed the test results with the patient.  Explained to her that since she did not finish the medication for BV that the symptoms that she has at this time are returning.  Explained to her that bacterial vaginosis is a imbalance in the vaginal area.  She  will need to complete this medication.  She was also given a prescription for UTI due to the urethral spasms she has been experiencing.  She was given a prescription for Flagyl and Omnicef.  Diflucan if she develops yeast after the Omnicef.  Explained to her it is important that the vaginal area returned to its normal balance to prevent the BV and yeast.  She states she understands.  She will follow-up with her regular doctor if not improving in 3 to 5 days.  A urine culture was added to ensure the patient is on the correct antibiotic.  The patient was discharged in stable condition in the care of her husband.  She states she understands all discharge instructions and will return if needed.  As part of my medical decision making, I reviewed the following data within the electronic MEDICAL RECORD NUMBER Nursing notes reviewed and incorporated, Labs reviewed wet prep positive clue cells and WBCs, urinalysis positive for moderate leuks, Old chart reviewed, Notes from prior ED visits and [place] Controlled Substance Database  ____________________________________________   FINAL CLINICAL IMPRESSION(S) / ED DIAGNOSES  Final diagnoses:  BV (bacterial vaginosis)  Lower urinary tract infectious disease      NEW MEDICATIONS STARTED DURING THIS VISIT:  New Prescriptions   CEFDINIR (OMNICEF) 300 MG CAPSULE    Take 1 capsule (300 mg total) by mouth 2 (two) times daily.   FLUCONAZOLE (DIFLUCAN) 150 MG TABLET    Take one now and one in a week   METRONIDAZOLE (FLAGYL) 500 MG TABLET    Take 1 tablet (500 mg total) by mouth 3 (three) times daily.     Note:  This document was prepared using Dragon voice recognition software and may include unintentional dictation errors.    Faythe GheeFisher, Susan W, PA-C 04/05/18 1610    Rockne MenghiniNorman, Anne-Caroline, MD 04/08/18 2226

## 2018-04-05 NOTE — ED Triage Notes (Signed)
Patient 24 days postpartum from a vaginal delivery. Reports for 1 week having urinary frequency and burning with urination. Also reports brown discharge with wiping but states "I think that's just dried up birth from the delivery.

## 2018-04-08 LAB — URINE CULTURE

## 2018-04-15 ENCOUNTER — Telehealth: Payer: Self-pay

## 2018-04-15 NOTE — Telephone Encounter (Signed)
Pt reports her milk isn't coming in right. She is inquiring if there is anything she can take. [id number]#[phone].

## 2018-04-23 ENCOUNTER — Other Ambulatory Visit: Payer: Self-pay | Admitting: Maternal Newborn

## 2018-04-23 ENCOUNTER — Encounter: Payer: Self-pay | Admitting: Certified Nurse Midwife

## 2018-04-23 ENCOUNTER — Ambulatory Visit (INDEPENDENT_AMBULATORY_CARE_PROVIDER_SITE_OTHER): Payer: Medicaid Other | Admitting: Certified Nurse Midwife

## 2018-04-23 DIAGNOSIS — O9279 Other disorders of lactation: Secondary | ICD-10-CM

## 2018-04-23 DIAGNOSIS — Z30430 Encounter for insertion of intrauterine contraceptive device: Secondary | ICD-10-CM

## 2018-04-23 MED ORDER — METOCLOPRAMIDE HCL 10 MG PO TABS: 10.0000 mg | ORAL_TABLET | Freq: Three times a day (TID) | ORAL | 0 refills | Status: DC

## 2018-04-23 NOTE — Progress Notes (Signed)
Postpartum Visit  Chief Complaint:  Chief Complaint  Patient presents with  . Postpartum Care    Concerned not stitched up all the way - doesn't feel right; breastmilk isn't coming in;    History of Present Illness: Alexis Horne is a 27 y.o. G1P1001 WF who presents for her 6 week postpartum visit.  Date of delivery: 03/12/2018 Type of delivery: Vaginal delivery - Vacuum or forceps assisted  no Episiotomy No.  Laceration: yes, second degree perineal laceration  Pregnancy or labor problems:  Yes, obesity, Uterine atony Any problems since the delivery:  Yes, seen in ER for chest pain and headache in August and had a negative CT of the chest and head. Seen in ER in Sept for E coli UTI and BV Bleeding tapered off then started having heavier bleeding this week. LMP: 30 Sept Newborn Details:  SINGLETON :  1. Alexis's name: Alexis Horne. Birth weight: 7#6oz Maternal Details:  Breast Feeding: Has been formula feeding but now wants to breast feed. Alexis has latched a couple times. Has not been pumping. Post partum depression/anxiety noted:  no Edinburgh Post-Partum Depression Score:  0  Date of last PAP: ?  normal   at [facility] Review of Systems: ROS  Past Medical History:  Past Medical History:  Diagnosis Date  . Deaf, left   . Obesity     Past Surgical History:  Past Surgical History:  Procedure Laterality Date  . NO PAST SURGERIES      Family History:  Family History  Problem Relation Age of Onset  . Hypertension Mother   . Diabetes Mother   . Cancer Father 55       colon  . Congestive Heart Failure Father   . Hypertension Father   . Diabetes Father     Social History:  Social History   Socioeconomic History  . Marital status: Single    Spouse name: Not on file  . Number of children: 1  . Years of education: 22  . Highest education level: Not on file  Occupational History  . Occupation: STAY AT HOME MOM  Social Needs  . Financial resource strain:  Not on file  . Food insecurity:    Worry: Not on file    Inability: Not on file  . Transportation needs:    Medical: Not on file    Non-medical: Not on file  Tobacco Use  . Smoking status: Never Smoker  . Smokeless tobacco: Never Used  Substance and Sexual Activity  . Alcohol use: No  . Drug use: No  . Sexual activity: Not Currently    Comment: Not in the last 24 hours  Lifestyle  . Physical activity:    Days per week: Not on file    Minutes per session: Not on file  . Stress: Not on file  Relationships  . Social connections:    Talks on phone: Not on file    Gets together: Not on file    Attends religious service: Not on file    Active member of club or organization: Not on file    Attends meetings of clubs or organizations: Not on file    Relationship status: Not on file  . Intimate partner violence:    Fear of current or ex partner: Not on file    Emotionally abused: Not on file    Physically abused: Not on file    Forced sexual activity: Not on file  Other Topics Concern  .  Not on file  Social History Narrative  . Not on file    Allergies:  Allergies  Allergen Reactions  . Ibuprofen Swelling  . Kiwi Extract     Swollen and itchy    Medications:none   Physical Exam Vitals: BP 116/80   Pulse 96   Ht 5\' 4"  (1.626 m)   Wt 246 lb (111.6 kg)   LMP 04/19/2018   BMI 42.23 kg/m  General: WF in NAD HEENT: normocephalic, anicteric Neck: No thyroid enlargement, no palpable nodules, no cervical lymphadenpathy Breast: soft,  no inflammation, no masses, nipples intact Pulmonary: No increased work of breathing, CTAB Heart: RRR without murmur Abdomen: Soft, non-tender, non-distended.  Umbilicus without lesions.  No hepatomegaly or masses palpable. No evidence of hernia. Genitourinary:  External: Well healed perineum, no lesions or inflammation    Vagina: Normal vaginal mucosa, no evidence of prolapse, moderate blood in vault    Cervix: Grossly normal in  appearance, closed  Uterus: AV, well involuted, mobile, non-tender  Adnexa: No adnexal masses, non-tender  Rectal: deferred Extremities: no edema, erythema, or tenderness Neurologic: Grossly intact Psychiatric: mood appropriate, affect full  Assessment: 27 y.o. G1P1001 with normal 6 week postpartum visit Desires to breast feed-given number for lactation consultant, advised to start breast feeding or pumping every 2-3 hours during the day and formula feed afterward. Can start to take fen u greek  Plan:  1) Contraception- patient desires to use Mirena IUD. Discussed insertion procedure, MOA, and risks including expulsion, perforation, PID and irregular bleeding.  Desires insertion today. See procedure note.  2) Pap not done due to bleeding  3) Patient underwent screening for postpartum depression with no concerns noted.  4) Discussed return to normal activiy  5) Follow up 1 month for Pap and IUD follow up     GYNECOLOGY OFFICE PROCEDURE NOTE  Alexis Horne is a 27 y.o. G1P1001 here for 6 week postpartum visit and  Mirena IUD insertion.   IUD Insertion Procedure Note Patient identified, informed consent performed, consent signed.   Discussed risks of irregular bleeding, cramping, infection, expulsion,malpositioning or misplacement of the IUD outside the uterus which may require further procedure such as laparoscopy. Time out was performed. LMP 30 Sept 2019  On bimanual exam, uterus was Anteverted. Speculum placed in the vagina.  Cervix visualized.  Cleaned with Betadine x 2. Cervix was sprayed with Hurricaine anesthetic and  grasped anteriorly with a single tooth tenaculum.  Uterus sounded to 6 cm.  Mirena  IUD placed per manufacturer's recommendations.  Strings trimmed to 3 cm. Tenaculum was removed, and silver nitrate was applied to tenaculum sites for hemostasis.  Patient tolerated procedure well.   Patient was given post-procedure instructions.  She was advised to have backup  contraception for one week.  Patient was also asked to check IUD strings periodically and follow up in 4 weeks for IUD check.  Farrel Conners, CNM 04/25/18

## 2018-04-23 NOTE — Progress Notes (Signed)
Responded to patient call about low milk supply. She would like to try Reglan to see whether it is helpful. Rx for metoclopramide.

## 2018-04-25 ENCOUNTER — Encounter: Payer: Self-pay | Admitting: Certified Nurse Midwife

## 2018-05-25 ENCOUNTER — Encounter: Payer: Self-pay | Admitting: Certified Nurse Midwife

## 2018-05-25 ENCOUNTER — Ambulatory Visit (INDEPENDENT_AMBULATORY_CARE_PROVIDER_SITE_OTHER): Payer: Medicaid Other

## 2018-05-25 ENCOUNTER — Ambulatory Visit (INDEPENDENT_AMBULATORY_CARE_PROVIDER_SITE_OTHER): Payer: Medicaid Other | Admitting: Certified Nurse Midwife

## 2018-05-25 VITALS — BP 110/80 | HR 72 | Ht 65.0 in | Wt 245.0 lb

## 2018-05-25 DIAGNOSIS — N921 Excessive and frequent menstruation with irregular cycle: Secondary | ICD-10-CM

## 2018-05-25 DIAGNOSIS — R109 Unspecified abdominal pain: Secondary | ICD-10-CM | POA: Diagnosis not present

## 2018-05-25 DIAGNOSIS — Z30431 Encounter for routine checking of intrauterine contraceptive device: Secondary | ICD-10-CM

## 2018-05-25 NOTE — Progress Notes (Addendum)
  History of Present Illness:  Alexis Horne is a 27 y.o. that had a Mirena IUD placed approximately 1 month ago at her postpartum visit. . Since that time, she states that she has had bleeding. Was bleeding at the time the IUD was inserted, and after IUD the bleeding became dark brown or black in color and the flow decreased. The bleeding picked back up yesterday and she is changing her pad every 2-3 hours and is having cramping. She is no longer breast feeding  PMHx: She  has a past medical history of Deaf, left and Obesity. Also,  has a past surgical history that includes No past surgeries., family history includes Cancer (age of onset: 21) in her father; Congestive Heart Failure in her father; Diabetes in her father and mother; Hypertension in her father and mother.,  reports that she has never smoked. She has never used smokeless tobacco. She reports that she does not drink alcohol or use drugs.  She has a current medication list which includes the following prescription(s): cetirizine, fluticasone, and levonorgestrel. Also, is allergic to ibuprofen and kiwi extract.  ROS  Physical Exam:  BP 110/80   Pulse 72   Ht 5\' 5"  (1.651 m)   Wt 245 lb (111.1 kg)   LMP  (LMP Unknown)   BMI 40.77 kg/m  Body mass index is 40.77 kg/m. Constitutional: Well nourished, well developed female in no acute distress.  Neuro: Grossly intact Psych:  Normal mood and affect.    Pelvic exam: External/BUS: no lesion, no discharge Vagina: no lesions,small amount blood in vault Cervix: Two IUD strings present seen coming from the cervical os. I did not palpate the end of the IUD at the cervical os.   Assessment: IUD strings visible/ persistent bleeding after the IUD can be normal, and she is probably now having a menses. She is concerned that the IUD may not be in the uterus correctly and would like an ultrasound to confirm correct location  Plan: A pelvic ultrasound was done which confirmed the correct  location of the IUD  Will try to slow bleeding with OCPs. No contraindications to pills. Two samples of Lo Loestrin given to patient with instructions on how to take. Advised patient to follow up with me if bleeding does not regulate after first pack of pills Unable to do Pap due to bleeding. Will need to reschedule when bleeding stops.  Farrel Conners, CNM

## 2018-05-30 ENCOUNTER — Emergency Department: Payer: Medicaid Other

## 2018-05-30 ENCOUNTER — Emergency Department
Admission: EM | Admit: 2018-05-30 | Discharge: 2018-05-30 | Disposition: A | Discharge status: Home / Self Care | Payer: Medicaid Other | Attending: Emergency Medicine | Admitting: Emergency Medicine

## 2018-05-30 ENCOUNTER — Other Ambulatory Visit: Payer: Self-pay

## 2018-05-30 ENCOUNTER — Encounter: Payer: Self-pay | Admitting: Emergency Medicine

## 2018-05-30 DIAGNOSIS — S39012A Strain of muscle, fascia and tendon of lower back, initial encounter: Secondary | ICD-10-CM

## 2018-05-30 DIAGNOSIS — R300 Dysuria: Secondary | ICD-10-CM | POA: Diagnosis present

## 2018-05-30 DIAGNOSIS — M545 Low back pain: Secondary | ICD-10-CM | POA: Diagnosis not present

## 2018-05-30 DIAGNOSIS — Z87442 Personal history of urinary calculi: Secondary | ICD-10-CM | POA: Insufficient documentation

## 2018-05-30 DIAGNOSIS — Z3202 Encounter for pregnancy test, result negative: Secondary | ICD-10-CM | POA: Diagnosis not present

## 2018-05-30 DIAGNOSIS — N390 Urinary tract infection, site not specified: Secondary | ICD-10-CM | POA: Diagnosis not present

## 2018-05-30 DIAGNOSIS — R319 Hematuria, unspecified: Secondary | ICD-10-CM

## 2018-05-30 LAB — URINALYSIS, COMPLETE (UACMP) WITH MICROSCOPIC
BILIRUBIN URINE: NEGATIVE
Glucose, UA: NEGATIVE mg/dL
Ketones, ur: NEGATIVE mg/dL
NITRITE: NEGATIVE
PH: 5 (ref 5.0–8.0)
Protein, ur: 30 mg/dL — AB
SPECIFIC GRAVITY, URINE: 1.031 — AB (ref 1.005–1.030)

## 2018-05-30 LAB — PREGNANCY, URINE: Preg Test, Ur: NEGATIVE

## 2018-05-30 MED ORDER — ACETAMINOPHEN 500 MG PO TABS
ORAL_TABLET | ORAL | Status: AC
  Filled 2018-05-30: qty 2

## 2018-05-30 MED ORDER — CIPROFLOXACIN HCL 250 MG PO TABS: 250.0000 mg | ORAL_TABLET | Freq: Two times a day (BID) | ORAL | 0 refills | Status: DC

## 2018-05-30 MED ORDER — ACETAMINOPHEN 325 MG PO TABS
650.0000 mg | ORAL_TABLET | Freq: Once | ORAL | Status: AC
  Administered 2018-05-30: 650 mg via ORAL

## 2018-05-30 MED ORDER — CIPROFLOXACIN HCL 500 MG PO TABS
500.0000 mg | ORAL_TABLET | Freq: Once | ORAL | Status: AC
  Administered 2018-05-30: 500 mg via ORAL
  Filled 2018-05-30: qty 1

## 2018-05-30 NOTE — ED Provider Notes (Signed)
[facility] Emergency Department Provider Note  ____________________________________________   First MD Initiated Contact with Patient 05/30/18 1924     (approximate)  I have reviewed the triage vital signs and the nursing notes.   HISTORY  Chief Complaint Back Pain    HPI Alexis Horne is a 27 y.o. female presents emergency department complaining of low back pain and dysuria.  She is afraid she has a kidney infection.  Does have history of kidney stones.  She was treated a few weeks ago here.  She was given Omnicef for her UTI.  The urine culture showed E. coli with no resistance to any antibiotics.  She denies fever chills.  She denies vaginal discharge.    Past Medical History:  Diagnosis Date  . Deaf, left   . Obesity     Patient Active Problem List   Diagnosis Date Noted  . Postpartum care following vaginal delivery 03/12/2018  . BMI 40.0-44.9, adult (HCC) 08/19/2017    Past Surgical History:  Procedure Laterality Date  . NO PAST SURGERIES      Prior to Admission medications   Medication Sig Start Date End Date Taking? Authorizing Provider  cetirizine (ZYRTEC) 10 MG tablet Take 1 tablet by mouth as needed. 05/16/18 06/15/18  [provider]  ciprofloxacin (CIPRO) 250 MG tablet Take 1 tablet (250 mg total) by mouth 2 (two) times daily. 05/30/18   Fisher, Roselyn Bering, PA-C  fluticasone (FLONASE) 50 MCG/ACT nasal spray Place 2 sprays into both nostrils as needed. 05/16/18 05/16/19  [provider]  levonorgestrel (MIRENA) 20 MCG/24HR IUD 1 each by Intrauterine route once.    [provider]    Allergies Ibuprofen and Kiwi extract  Family History  Problem Relation Age of Onset  . Hypertension Mother   . Diabetes Mother   . Cancer Father 42       colon  . Congestive Heart Failure Father   . Hypertension Father   . Diabetes Father     Social History Social History   Tobacco Use  . Smoking status:  Never Smoker  . Smokeless tobacco: Never Used  Substance Use Topics  . Alcohol use: No  . Drug use: No    Review of Systems  Constitutional: No fever/chills Eyes: No visual changes. ENT: No sore throat. Respiratory: Denies cough Genitourinary: Positive for dysuria. Musculoskeletal: Positive for back pain. Skin: Negative for rash.    ____________________________________________   PHYSICAL EXAM:  VITAL SIGNS: ED Triage Vitals  Enc Vitals Group     BP 05/30/18 1820 128/66     Pulse Rate 05/30/18 1820 91     Resp 05/30/18 1820 18     Temp 05/30/18 1820 97.7 F (36.5 C)     Temp Source 05/30/18 1820 Oral     SpO2 05/30/18 1820 100 %     Weight 05/30/18 1821 240 lb (108.9 kg)     Height 05/30/18 1821 5\' 5"  (1.651 m)     Head Circumference --      Peak Flow --      Pain Score 05/30/18 1823 7     Pain Loc --      Pain Edu? --      Excl. in GC? --     Constitutional: Alert and oriented. Well appearing and in no acute distress. Eyes: Conjunctivae are normal.  Head: Atraumatic. Nose: No congestion/rhinnorhea. Mouth/Throat: Mucous membranes are moist.   Neck:  supple no lymphadenopathy noted Cardiovascular: Normal rate, regular  rhythm. Heart sounds are normal Respiratory: Normal respiratory effort.  No retractions, lungs c t a  Abd: soft nontender bs normal all 4 quad, no CVA tenderness is noted GU: deferred Musculoskeletal: FROM all extremities, warm and well perfused Neurologic:  Normal speech and language.  Skin:  Skin is warm, dry and intact. No rash noted. Psychiatric: Mood and affect are normal. Speech and behavior are normal.  ____________________________________________   LABS (all labs ordered are listed, but only abnormal results are displayed)  Labs Reviewed  URINALYSIS, COMPLETE (UACMP) WITH MICROSCOPIC - Abnormal; Notable for the following components:      Result Value   Color, Urine YELLOW (*)    APPearance HAZY (*)    Specific Gravity, Urine  1.031 (*)    Hgb urine dipstick LARGE (*)    Protein, ur 30 (*)    Leukocytes, UA TRACE (*)    Bacteria, UA RARE (*)    All other components within normal limits  PREGNANCY, URINE   ____________________________________________   ____________________________________________  RADIOLOGY  CT renal stone study is negative for kidney stones  ____________________________________________   PROCEDURES  Procedure(s) performed: No  Procedures    ____________________________________________   INITIAL IMPRESSION / ASSESSMENT AND PLAN / ED COURSE  Pertinent labs & imaging results that were available during my care of the patient were reviewed by me and considered in my medical decision making (see chart for details).   Patient is 27 year old female presents emergency department complaining of lower back pain with dysuria.  She is also concerned that she has kidney stones.  On physical exam patient appears well.  The UA shows blood and leuks with rare bacteria.  CT for renal stone study is negative for stone.  Explained the CT results to the patient.  She is given prescription for Cipro.  She is to follow-up with her regular doctor at [facility].  If her symptoms continue to be more for urology she is to follow-up with Dr. Lonna Cobb.  She states she understands will comply.  She is discharged in stable condition.     As part of my medical decision making, I reviewed the following data within the electronic MEDICAL RECORD NUMBER Nursing notes reviewed and incorporated, Labs reviewed UA positive for leuks, urine pregnant negative, Old chart reviewed, Radiograph reviewed CT renal stone study is negative for kidney stones, Notes from prior ED visits and [place] Controlled Substance Database  ____________________________________________   FINAL CLINICAL IMPRESSION(S) / ED DIAGNOSES  Final diagnoses:  Strain of lumbar region, initial encounter  Urinary tract infection with hematuria, site  unspecified      NEW MEDICATIONS STARTED DURING THIS VISIT:  Discharge Medication List as of 05/30/2018 10:24 PM    START taking these medications   Details  ciprofloxacin (CIPRO) 250 MG tablet Take 1 tablet (250 mg total) by mouth 2 (two) times daily., Starting Sun 05/30/2018, Normal         Note:  This document was prepared using Dragon voice recognition software and may include unintentional dictation errors.    Faythe Ghee, PA-C 05/30/18 2300    Jeanmarie Plant, MD 05/30/18 [phone]

## 2018-05-30 NOTE — ED Triage Notes (Signed)
Pt to ED via POV c/o Lower back pain and dysuria. Pt states that her symptoms started last night. Pt is in NAD at this time.

## 2018-05-30 NOTE — Discharge Instructions (Addendum)
Follow-up with your regular doctor Dr. Elesa Massed.  Please call her for an appointment.  Or if you continue to have urinary tract symptoms you should follow-up with Dr. Lonna Cobb.  First start with your OB/GYN doctor.  You have been given a prescription for Cipro.  The last urine culture we performed on you showed E. coli which is the most typical bacteria for urinary tract infections.  Cipro will cover this.

## 2018-06-14 ENCOUNTER — Emergency Department
Admission: EM | Admit: 2018-06-14 | Discharge: 2018-06-14 | Disposition: A | Discharge status: Home / Self Care | Payer: Medicaid Other | Attending: Emergency Medicine | Admitting: Emergency Medicine

## 2018-06-14 ENCOUNTER — Encounter: Payer: Self-pay | Admitting: Emergency Medicine

## 2018-06-14 ENCOUNTER — Emergency Department: Payer: Medicaid Other

## 2018-06-14 ENCOUNTER — Other Ambulatory Visit: Payer: Self-pay

## 2018-06-14 DIAGNOSIS — S39012A Strain of muscle, fascia and tendon of lower back, initial encounter: Secondary | ICD-10-CM | POA: Diagnosis not present

## 2018-06-14 DIAGNOSIS — Y939 Activity, unspecified: Secondary | ICD-10-CM | POA: Insufficient documentation

## 2018-06-14 DIAGNOSIS — S161XXA Strain of muscle, fascia and tendon at neck level, initial encounter: Secondary | ICD-10-CM | POA: Insufficient documentation

## 2018-06-14 DIAGNOSIS — S0990XA Unspecified injury of head, initial encounter: Secondary | ICD-10-CM | POA: Diagnosis present

## 2018-06-14 DIAGNOSIS — Z79899 Other long term (current) drug therapy: Secondary | ICD-10-CM | POA: Insufficient documentation

## 2018-06-14 DIAGNOSIS — Y999 Unspecified external cause status: Secondary | ICD-10-CM | POA: Insufficient documentation

## 2018-06-14 DIAGNOSIS — Y92410 Unspecified street and highway as the place of occurrence of the external cause: Secondary | ICD-10-CM | POA: Diagnosis not present

## 2018-06-14 MED ORDER — OXYCODONE-ACETAMINOPHEN 5-325 MG PO TABS
1.0000 | ORAL_TABLET | Freq: Once | ORAL | Status: AC
  Administered 2018-06-14: 1 via ORAL
  Filled 2018-06-14: qty 1

## 2018-06-14 MED ORDER — BACLOFEN 10 MG PO TABS: 10.0000 mg | ORAL_TABLET | Freq: Three times a day (TID) | ORAL | 1 refills | Status: AC

## 2018-06-14 MED ORDER — PREDNISONE 10 MG (21) PO TBPK: ORAL_TABLET | ORAL | 0 refills | Status: DC

## 2018-06-14 NOTE — Discharge Instructions (Addendum)
Take the medication as prescribed.  Follow-up with your regular doctor if not better in 3 to 5 days.  Return to the emergency department worsening.  If you are not better in 1 week please follow-up with Dr. Elenor LegatoHooten's office.  YOu would need to call for an appointment.
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
## 2018-06-22 ENCOUNTER — Encounter: Payer: Self-pay | Admitting: Emergency Medicine

## 2018-06-22 ENCOUNTER — Emergency Department
Admission: EM | Admit: 2018-06-22 | Discharge: 2018-06-22 | Disposition: A | Discharge status: Home / Self Care | Payer: Medicaid Other | Attending: Emergency Medicine | Admitting: Emergency Medicine

## 2018-06-22 ENCOUNTER — Other Ambulatory Visit: Payer: Self-pay

## 2018-06-22 ENCOUNTER — Emergency Department: Payer: Medicaid Other

## 2018-06-22 DIAGNOSIS — Z79899 Other long term (current) drug therapy: Secondary | ICD-10-CM | POA: Diagnosis not present

## 2018-06-22 DIAGNOSIS — Y998 Other external cause status: Secondary | ICD-10-CM | POA: Diagnosis not present

## 2018-06-22 DIAGNOSIS — M25531 Pain in right wrist: Secondary | ICD-10-CM

## 2018-06-22 DIAGNOSIS — Y92410 Unspecified street and highway as the place of occurrence of the external cause: Secondary | ICD-10-CM | POA: Diagnosis not present

## 2018-06-22 DIAGNOSIS — Y9389 Activity, other specified: Secondary | ICD-10-CM | POA: Insufficient documentation

## 2018-06-22 DIAGNOSIS — S8992XA Unspecified injury of left lower leg, initial encounter: Secondary | ICD-10-CM | POA: Diagnosis present

## 2018-06-22 DIAGNOSIS — S8012XA Contusion of left lower leg, initial encounter: Secondary | ICD-10-CM | POA: Diagnosis not present

## 2018-06-22 MED ORDER — CYCLOBENZAPRINE HCL 10 MG PO TABS: 10.0000 mg | ORAL_TABLET | Freq: Three times a day (TID) | ORAL | 0 refills | Status: DC | PRN
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
## 2018-06-22 NOTE — Discharge Instructions (Signed)
Follow-up with the primary care provider of your choice.  Follow-up with orthopedics if your wrist or knee are not improving over the week.  Return to the emergency department for symptoms of change or worsen if unable to schedule an appointment.

## 2018-07-26 ENCOUNTER — Encounter: Payer: Self-pay | Admitting: Emergency Medicine

## 2018-07-26 ENCOUNTER — Other Ambulatory Visit: Payer: Self-pay

## 2018-07-26 DIAGNOSIS — R112 Nausea with vomiting, unspecified: Secondary | ICD-10-CM | POA: Insufficient documentation

## 2018-07-26 DIAGNOSIS — R1032 Left lower quadrant pain: Secondary | ICD-10-CM | POA: Insufficient documentation

## 2018-07-26 DIAGNOSIS — N390 Urinary tract infection, site not specified: Secondary | ICD-10-CM | POA: Insufficient documentation

## 2018-07-26 DIAGNOSIS — R1031 Right lower quadrant pain: Secondary | ICD-10-CM | POA: Insufficient documentation

## 2018-07-26 DIAGNOSIS — Z3202 Encounter for pregnancy test, result negative: Secondary | ICD-10-CM | POA: Insufficient documentation

## 2018-07-26 LAB — URINALYSIS, COMPLETE (UACMP) WITH MICROSCOPIC
Bilirubin Urine: NEGATIVE
GLUCOSE, UA: NEGATIVE mg/dL
Hgb urine dipstick: NEGATIVE
Ketones, ur: NEGATIVE mg/dL
Leukocytes, UA: NEGATIVE
NITRITE: NEGATIVE
PH: 6 (ref 5.0–8.0)
PROTEIN: NEGATIVE mg/dL
SPECIFIC GRAVITY, URINE: 1.02 (ref 1.005–1.030)

## 2018-07-26 LAB — CBC WITH DIFFERENTIAL/PLATELET
ABS IMMATURE GRANULOCYTES: 0.02 10*3/uL (ref 0.00–0.07)
BASOS PCT: 1 %
Basophils Absolute: 0 10*3/uL (ref 0.0–0.1)
Eosinophils Absolute: 0.1 10*3/uL (ref 0.0–0.5)
Eosinophils Relative: 1 %
HEMATOCRIT: 39.1 % (ref 36.0–46.0)
HEMOGLOBIN: 13 g/dL (ref 12.0–15.0)
Immature Granulocytes: 0 %
LYMPHS PCT: 28 %
Lymphs Abs: 2.3 10*3/uL (ref 0.7–4.0)
MCH: 29.5 pg (ref 26.0–34.0)
MCHC: 33.2 g/dL (ref 30.0–36.0)
MCV: 88.7 fL (ref 80.0–100.0)
MONO ABS: 0.5 10*3/uL (ref 0.1–1.0)
MONOS PCT: 6 %
NEUTROS ABS: 5.1 10*3/uL (ref 1.7–7.7)
Neutrophils Relative %: 64 %
PLATELETS: 250 10*3/uL (ref 150–400)
RBC: 4.41 MIL/uL (ref 3.87–5.11)
RDW: 11.9 % (ref 11.5–15.5)
WBC: 8.1 10*3/uL (ref 4.0–10.5)
nRBC: 0 % (ref 0.0–0.2)

## 2018-07-26 LAB — POCT PREGNANCY, URINE: Preg Test, Ur: NEGATIVE

## 2018-07-26 NOTE — ED Triage Notes (Signed)
Patient ambulatory to triage with steady gait, without difficulty or distress noted; pt st she had a neg and a pos pregnancy test at home; st "I'm feeling kicks" and I'm nauseated

## 2018-07-27 ENCOUNTER — Emergency Department
Admission: EM | Admit: 2018-07-27 | Discharge: 2018-07-27 | Disposition: A | Discharge status: Home / Self Care | Payer: Medicaid Other | Attending: Emergency Medicine | Admitting: Emergency Medicine

## 2018-07-27 DIAGNOSIS — R110 Nausea: Secondary | ICD-10-CM

## 2018-07-27 DIAGNOSIS — R1031 Right lower quadrant pain: Secondary | ICD-10-CM

## 2018-07-27 DIAGNOSIS — R1032 Left lower quadrant pain: Secondary | ICD-10-CM

## 2018-07-27 DIAGNOSIS — N390 Urinary tract infection, site not specified: Secondary | ICD-10-CM

## 2018-07-27 LAB — COMPREHENSIVE METABOLIC PANEL
ALBUMIN: 3.9 g/dL (ref 3.5–5.0)
ALK PHOS: 83 U/L (ref 38–126)
ALT: 9 U/L (ref 0–44)
ANION GAP: 6 (ref 5–15)
AST: 19 U/L (ref 15–41)
BUN: 11 mg/dL (ref 6–20)
CALCIUM: 9.1 mg/dL (ref 8.9–10.3)
CHLORIDE: 107 mmol/L (ref 98–111)
CO2: 24 mmol/L (ref 22–32)
Creatinine, Ser: 0.72 mg/dL (ref 0.44–1.00)
GFR calc Af Amer: >60 mL/min (ref >60)
GFR calc non Af Amer: >60 mL/min (ref >60)
GLUCOSE: 92 mg/dL (ref 70–99)
Potassium: 3.6 mmol/L (ref 3.5–5.1)
SODIUM: 137 mmol/L (ref 135–145)
Total Bilirubin: 0.5 mg/dL (ref 0.3–1.2)
Total Protein: 7.3 g/dL (ref 6.5–8.1)

## 2018-07-27 LAB — HCG, QUANTITATIVE, PREGNANCY

## 2018-07-27 LAB — LIPASE, BLOOD: Lipase: 34 U/L (ref 11–51)

## 2018-07-27 MED ORDER — CEPHALEXIN 500 MG PO CAPS: 500.0000 mg | ORAL_CAPSULE | Freq: Four times a day (QID) | ORAL | 0 refills | Status: AC

## 2018-07-27 MED ORDER — ONDANSETRON 4 MG PO TBDP: 4.0000 mg | ORAL_TABLET | Freq: Three times a day (TID) | ORAL | 0 refills | Status: DC | PRN

## 2018-07-27 NOTE — ED Provider Notes (Signed)
[facility] Emergency Department Provider Note  ____________________________________________  Time seen: Approximately 7:38 AM  I have reviewed the triage vital signs and the nursing notes.   HISTORY  Chief Complaint No chief complaint on file.    HPI Alexis Horne is a 28 y.o. female G1, P0 4 months postpartum status post Mirena placement with a history of left-sided deafness and obesity presenting for "queasiness" and "fluttering like kicks."  The patient reports that she did not wait for Mirena placement prior to restarting sexual intercourse after the delivery of her baby.  Over the past several weeks, she has had intermittent brief episodes of queasiness that completely resolve if she "lays down."  She also has "fluttering" in the lower abdomen similar to "kicks."  She reports a positive pregnancy test at home.  She is not had any vomiting, lightheadedness or syncope, urinary symptoms, vaginal bleeding.  She has Phenergan tablets at home but has not tried them for her nausea.  Past Medical History:  Diagnosis Date  . Deaf, left   . Obesity     Patient Active Problem List   Diagnosis Date Noted  . Postpartum care following vaginal delivery 03/12/2018  . BMI 40.0-44.9, adult (HCC) 08/19/2017    Past Surgical History:  Procedure Laterality Date  . NO PAST SURGERIES      Current Outpatient Rx  . Order #: [id number] Class: Normal  . Order #: [id number] Class: Print  . Order #: [id number] Class: Historical Med  . Order #: [id number] Class: Print  . Order #: [id number] Class: Historical Med  . Order #: [id number] Class: Historical Med  . Order #: [id number] Class: Print  . Order #: [id number] Class: Normal    Allergies Ibuprofen and Kiwi extract  Family History  Problem Relation Age of Onset  . Hypertension Mother   . Diabetes Mother   . Cancer Father 7325       colon  . Congestive Heart Failure Father   . Hypertension Father   . Diabetes Father      Social History Social History   Tobacco Use  . Smoking status: Never Smoker  . Smokeless tobacco: Never Used  Substance Use Topics  . Alcohol use: No  . Drug use: No    Review of Systems Constitutional: No fever/chills.  No lightheadedness or syncope. Eyes: No visual changes. ENT: No sore throat. No congestion or rhinorrhea. Cardiovascular: Denies chest pain. Denies palpitations. Respiratory: Denies shortness of breath.  No cough. Gastrointestinal: No abdominal pain.  Positive nausea, no vomiting.  No diarrhea.  No constipation. Genitourinary: Negative for dysuria.  Fluttering sensations in the pelvis. Musculoskeletal: Negative for back pain. Skin: Negative for rash. Neurological: Negative for headaches. No focal numbness, tingling or weakness.     ____________________________________________   PHYSICAL EXAM:  VITAL SIGNS: ED Triage Vitals  Enc Vitals Group     BP 07/26/18 2314 126/77     Pulse Rate 07/26/18 2314 76     Resp 07/26/18 2314 18     Temp 07/26/18 2314 98.2 F (36.8 C)     Temp Source 07/26/18 2314 Oral     SpO2 07/26/18 2314 99 %     Weight 07/26/18 2312 230 lb (104.3 kg)     Height 07/26/18 2312 5\' 5"  (1.651 m)     Head Circumference --      Peak Flow --      Pain Score 07/27/18 0437 0     Pain Loc --      Pain Edu? --  Excl. in GC? --     Constitutional: Alert and oriented. Answers questions appropriately. Eyes: Conjunctivae are normal.  EOMI. No scleral icterus. Head: Atraumatic. Nose: No congestion/rhinnorhea. Mouth/Throat: Mucous membranes are moist.  Neck: No stridor.  Supple.   Cardiovascular: Normal rate, regular rhythm. No murmurs, rubs or gallops.  Respiratory: Normal respiratory effort.  No accessory muscle use or retractions. Lungs CTAB.  No wheezes, rales or ronchi. Gastrointestinal: Obese.  Soft, nontender and nondistended.  No guarding or rebound.  No peritoneal signs. Musculoskeletal: No LE edema.  Neurologic:  A&Ox3.   Speech is clear.  Face and smile are symmetric.  EOMI.  Moves all extremities well. Skin:  Skin is warm, dry and intact. No rash noted. Psychiatric: Mood and affect are normal. Speech and behavior are normal.  Normal judgement  ____________________________________________   LABS (all labs ordered are listed, but only abnormal results are displayed)  Labs Reviewed  URINALYSIS, COMPLETE (UACMP) WITH MICROSCOPIC - Abnormal; Notable for the following components:      Result Value   Color, Urine YELLOW (*)    APPearance CLEAR (*)    Bacteria, UA RARE (*)    All other components within normal limits  CBC WITH DIFFERENTIAL/PLATELET  COMPREHENSIVE METABOLIC PANEL  LIPASE, BLOOD  HCG, QUANTITATIVE, PREGNANCY  POCT PREGNANCY, URINE   ____________________________________________  EKG  Not Indicated ____________________________________________  RADIOLOGY  No results found.  ____________________________________________   PROCEDURES  Procedure(s) performed: None  Procedures  Critical Care performed: No ____________________________________________   INITIAL IMPRESSION / ASSESSMENT AND PLAN / ED COURSE  Pertinent labs & imaging results that were available during my care of the patient were reviewed by me and considered in my medical decision making (see chart for details).  27 y.o. G1 P0 approximately 4 months postpartum and status post Mirena placement presenting for nausea with fluttering sensation in lower abdomen; positive pregnancy test at home.  Here, the patient's urine pregnancy test is negative and a serum hCG is pending.  Her laboratory studies are reassuring.  She does have some bacteriuria, which may be causing the fluttering sensation so I will give her 3-day course of antibiotics for this.  The patient has Phenergan at home and I will give her several tablets of Zofran for her queasiness.  I do not see any evidence of a life-threatening infection or intra-abdominal  surgical pathology today.  Anticipate the patient will be discharged home in stable condition with follow-up with OB/GYN and her PMD.  ____________________________________________  FINAL CLINICAL IMPRESSION(S) / ED DIAGNOSES  Final diagnoses:  Nausea without vomiting  Bilateral lower abdominal discomfort  Urinary tract infection without hematuria, site unspecified         NEW MEDICATIONS STARTED DURING THIS VISIT:  New Prescriptions   CEPHALEXIN (KEFLEX) 500 MG CAPSULE    Take 1 capsule (500 mg total) by mouth 4 (four) times daily for 3 days.   ONDANSETRON (ZOFRAN ODT) 4 MG DISINTEGRATING TABLET    Take 1 tablet (4 mg total) by mouth every 8 (eight) hours as needed for nausea or vomiting.      Rockne Menghini, MD 07/27/18 [phone]

## 2018-07-27 NOTE — Discharge Instructions (Addendum)
You may continue to use Phenergan for nausea; Zofran can also be useful for nausea.  Drink plenty of fluid to stay well-hydrated.  Return to the emergency department if you develop severe pain, fever, inability to keep down fluids, or any other symptoms concerning to you.

## 2019-02-01 IMAGING — CR DG CHEST 2V
3 series · 3 of 3 positions shown · non-contrast
Comparison: 02/16/2017

CLINICAL DATA: Left side chest pain

EXAM:
CHEST - 2 VIEW

[chest pa]
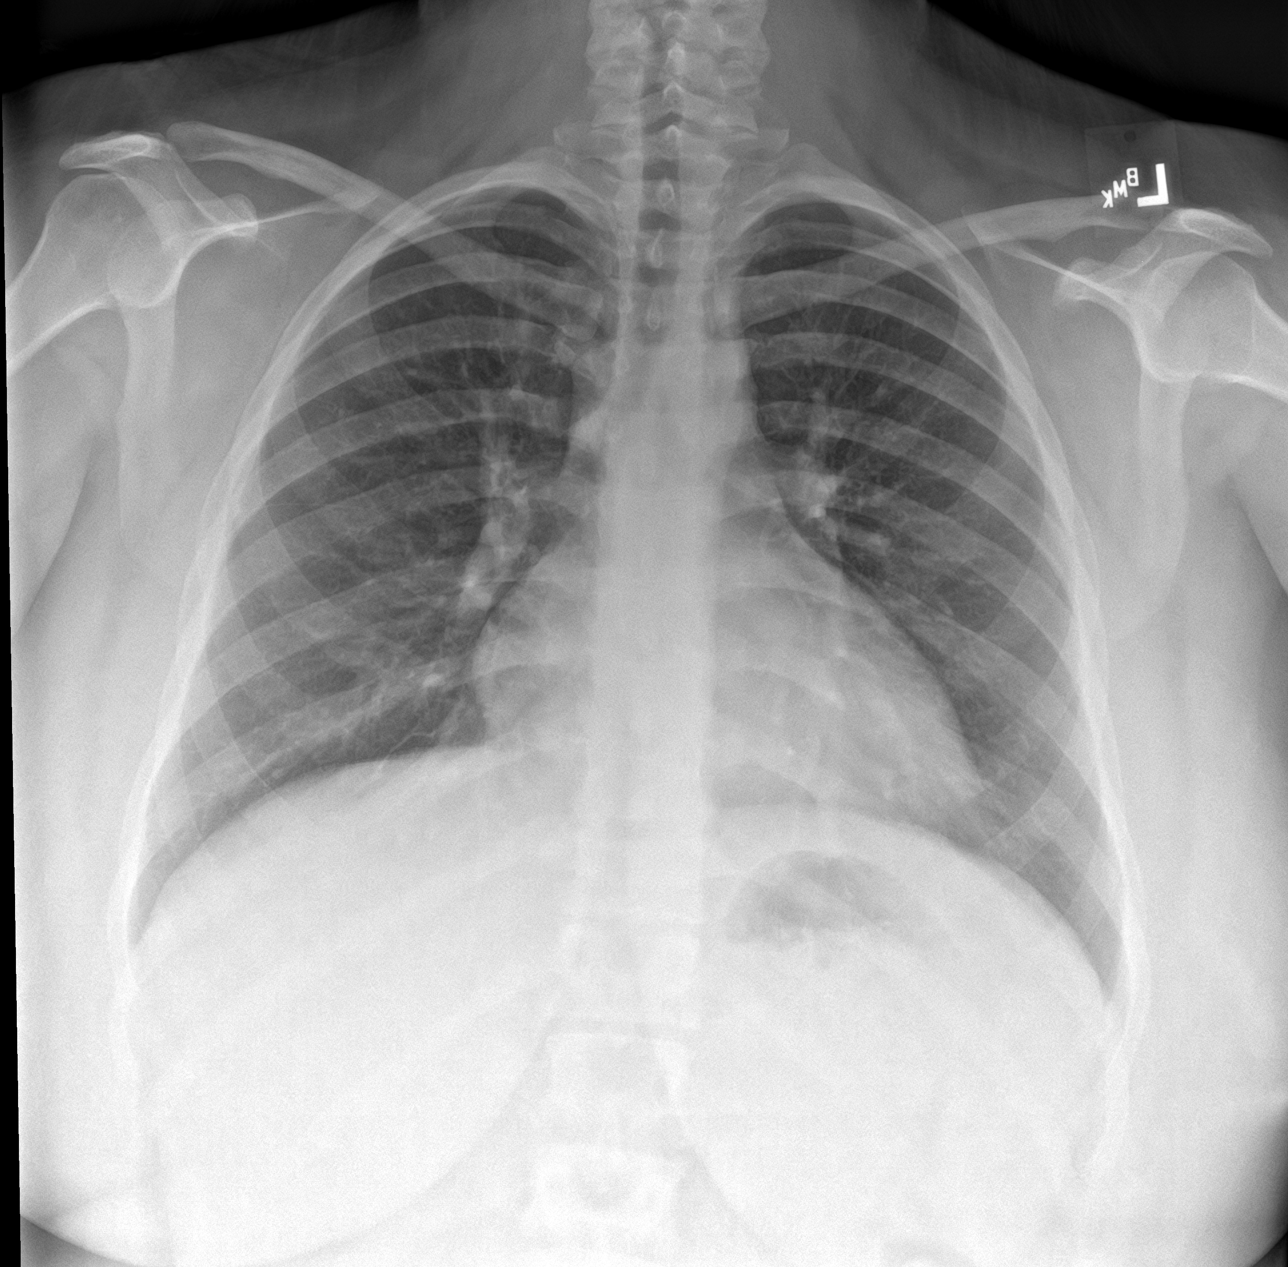

[chest lat (1 of 2)]
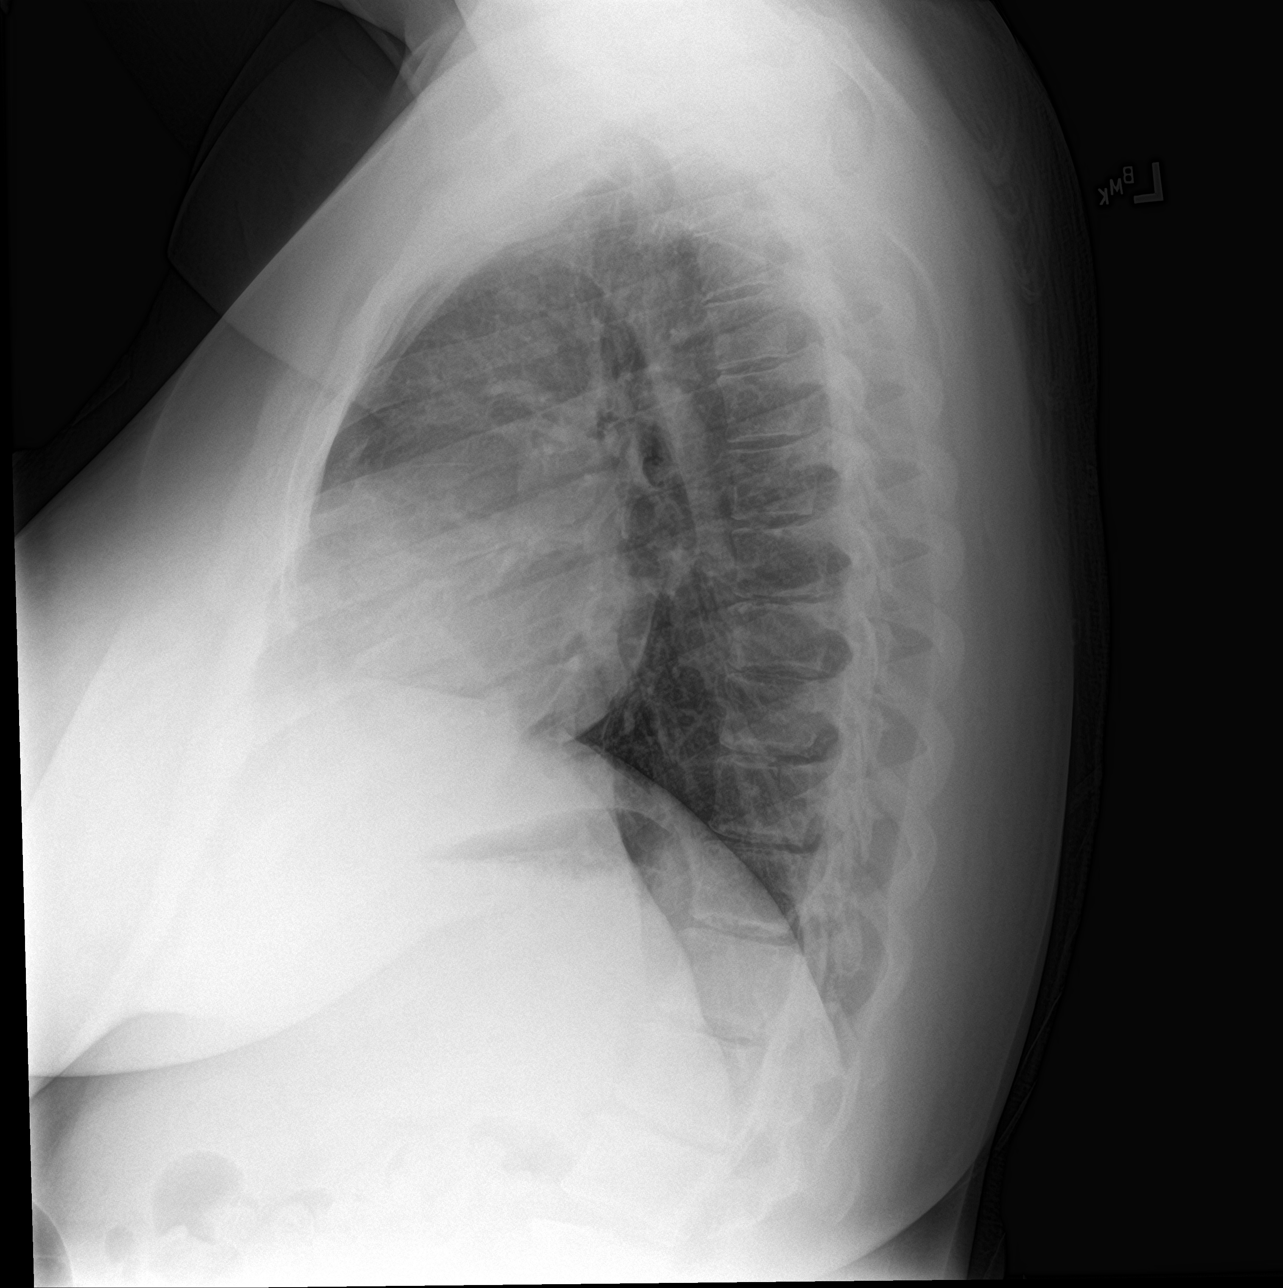

[chest lat (2 of 2)]
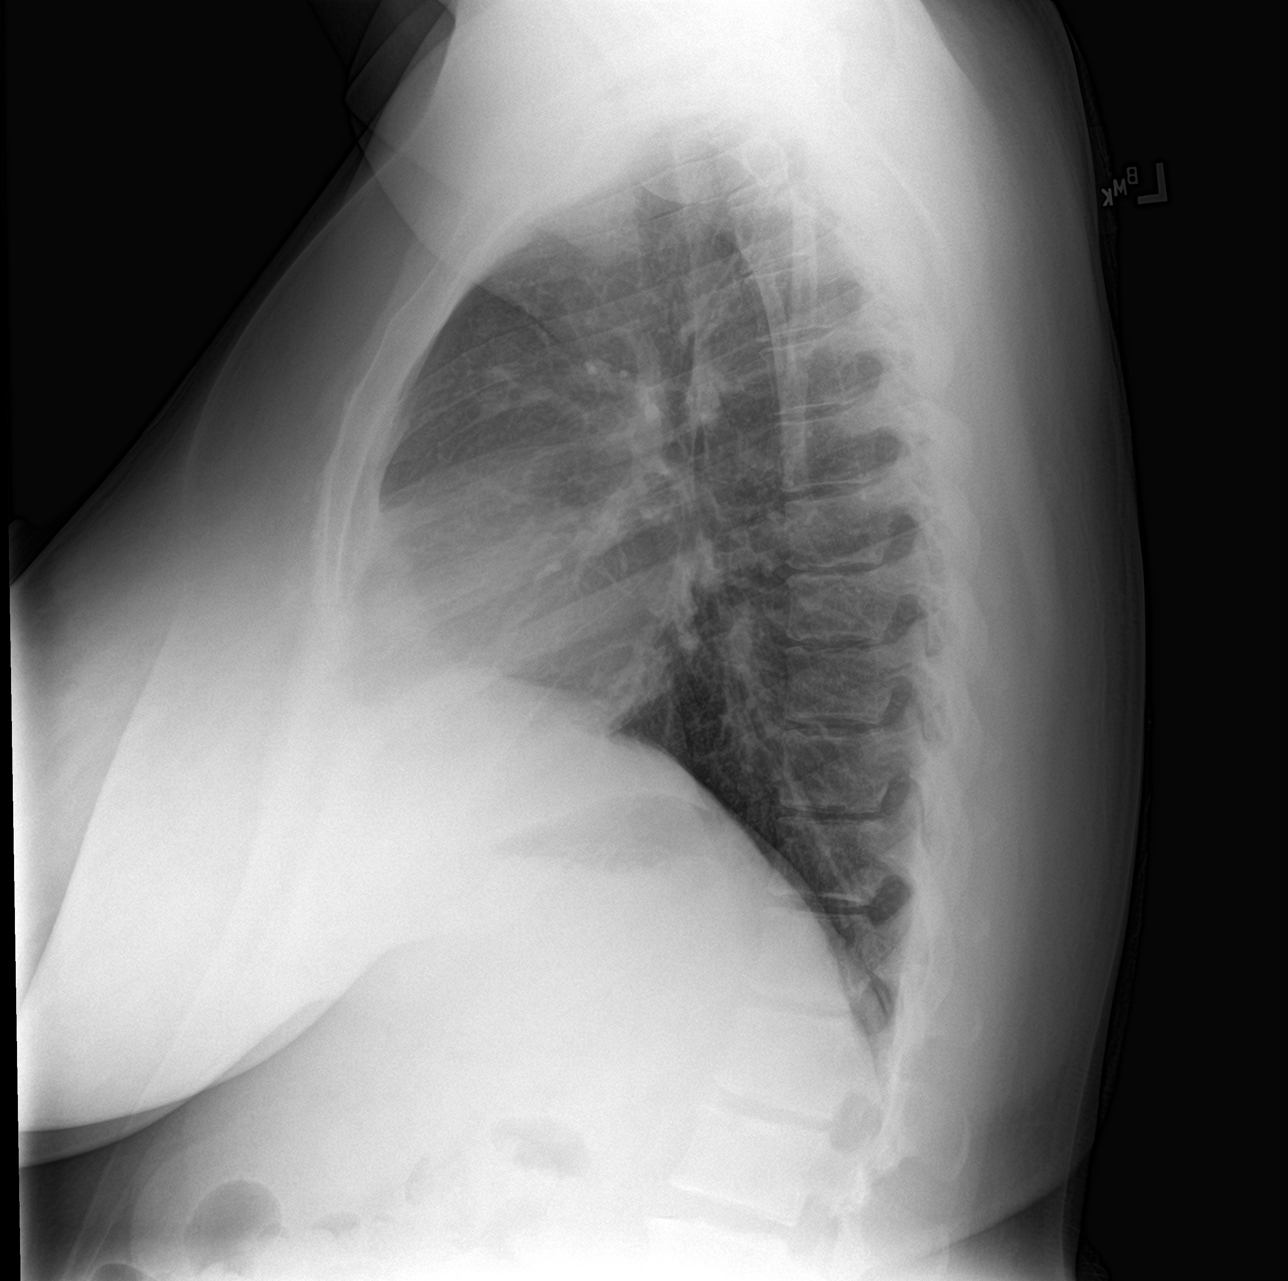

[3 of 3 positions shown; findings below may reference images not displayed]

FINDINGS: Heart and mediastinal contours are within normal limits. No focal
opacities or effusions. No acute bony abnormality.
IMPRESSION: No active cardiopulmonary disease.

## 2019-02-02 IMAGING — CT CT HEAD W/O CM
3 series · 16 of 45 positions shown, 19 images · non-contrast
Comparison: None.

CLINICAL DATA: 26 y/o F; left-sided chest pain, intermittent
shortness of breath headache.

EXAM:
CT HEAD WITHOUT CONTRAST
TECHNIQUE: Contiguous axial images were obtained from the base of the skull
through the vertex without intravenous contrast.

[Series 2: head wo · axial · 0.40mm/px · z∈[-79,+36]mm · 10 of 28 slices shown, 13 images]
[im 3/28  brain]
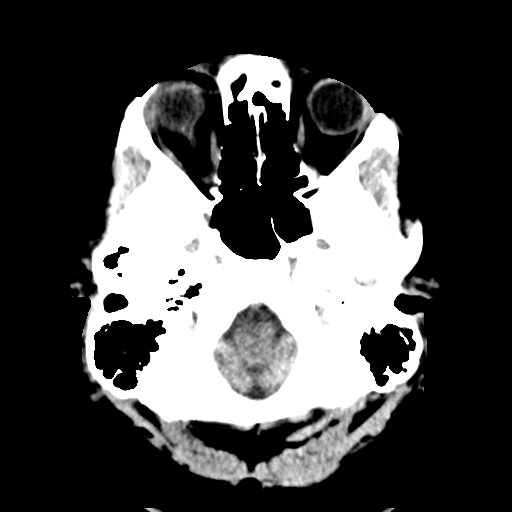
[im 3/28  bone]
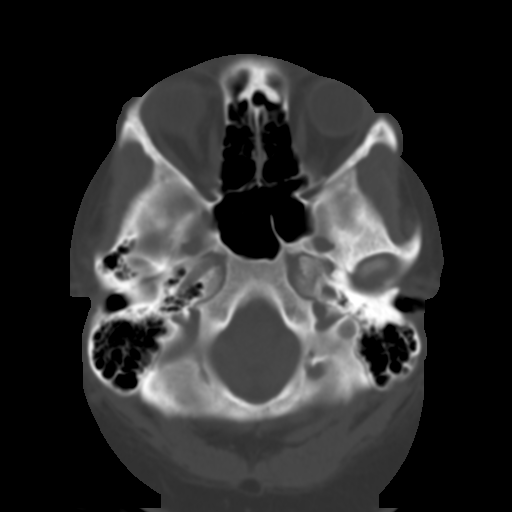
[im 5/28  brain]
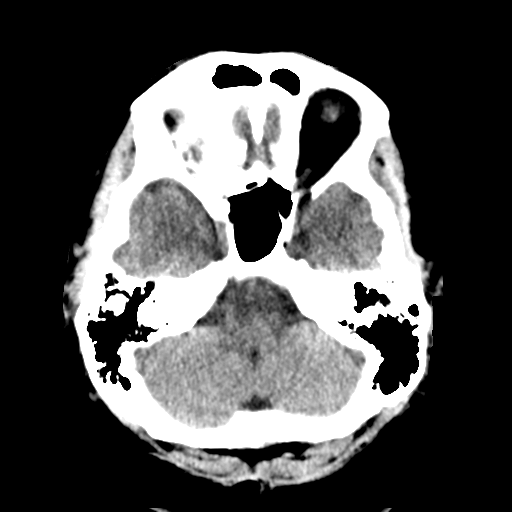
[im 8/28  brain]
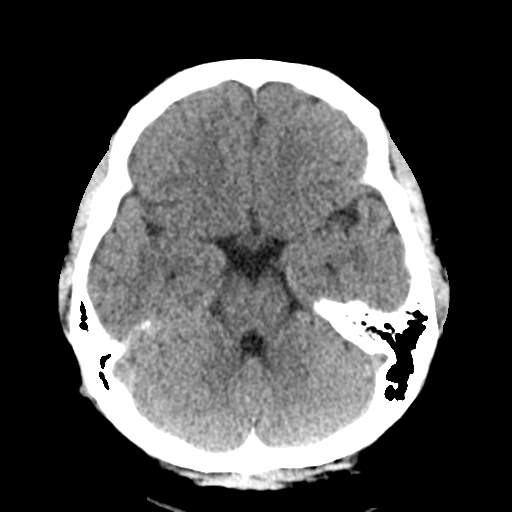
[im 11/28  brain]
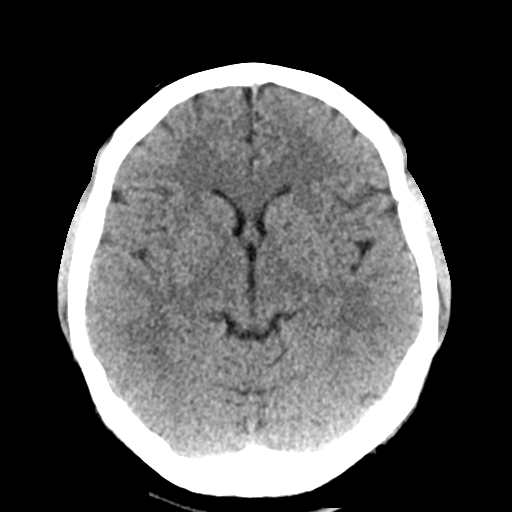
[im 13/28  brain]
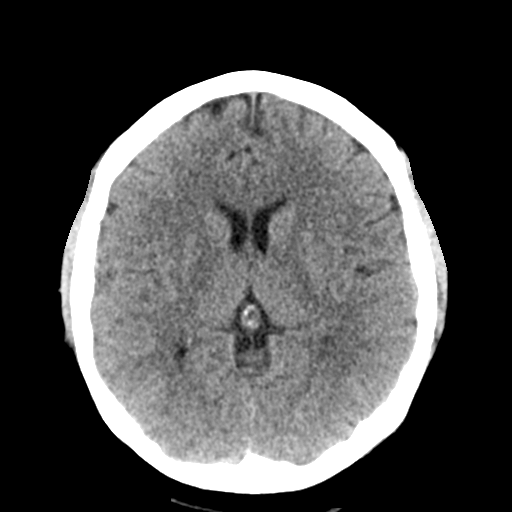
[im 13/28  bone]
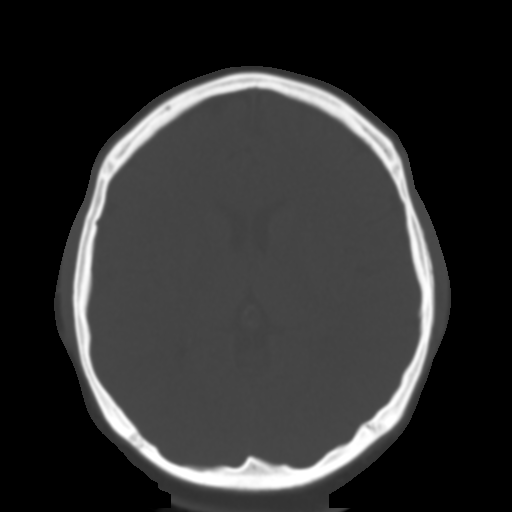
[im 16/28  brain]
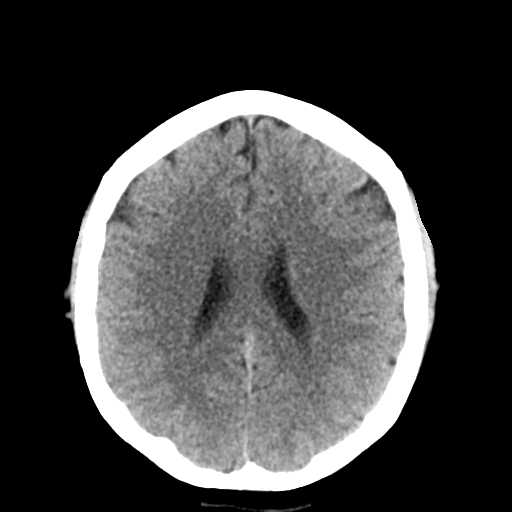
[im 18/28  brain]
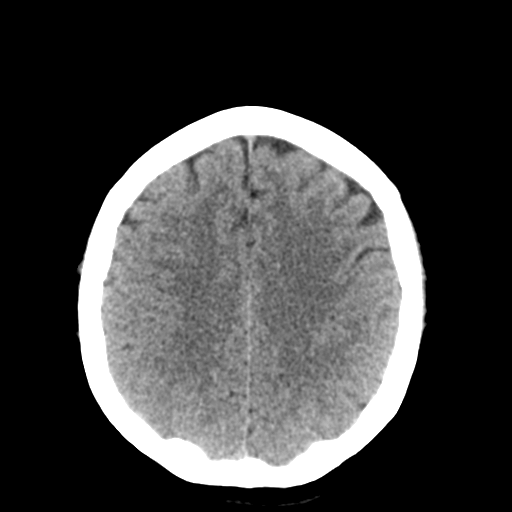
[im 21/28  brain]
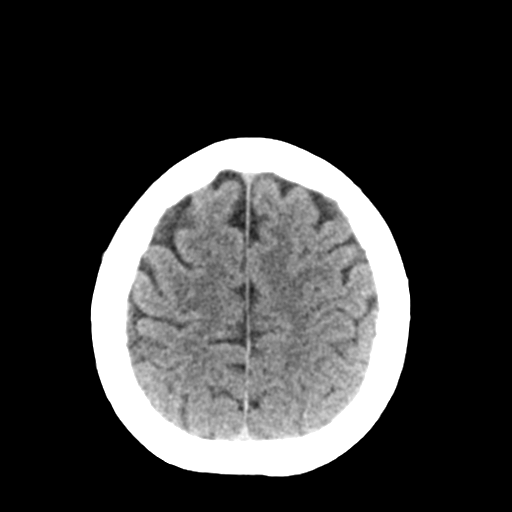
[im 24/28  brain]
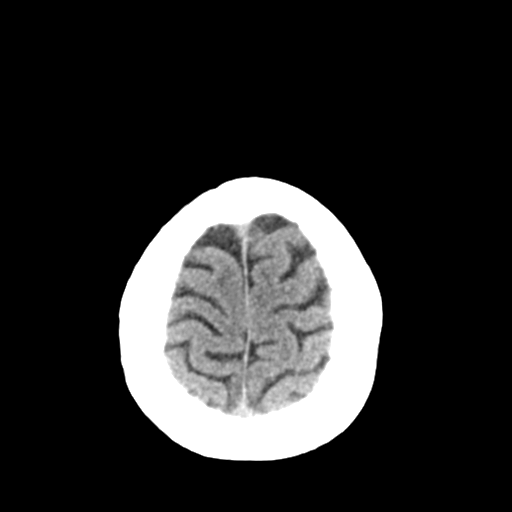
[im 24/28  bone]
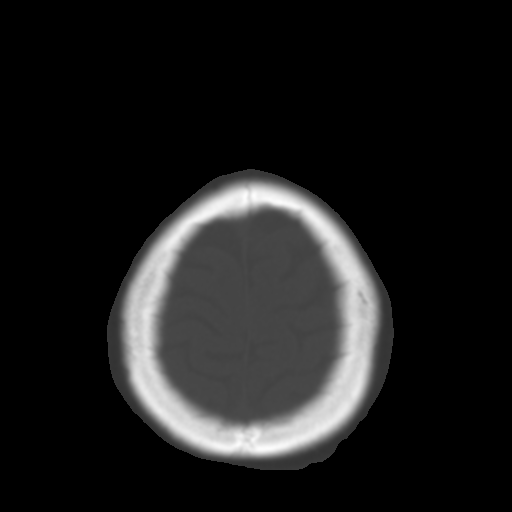
[im 26/28  brain]
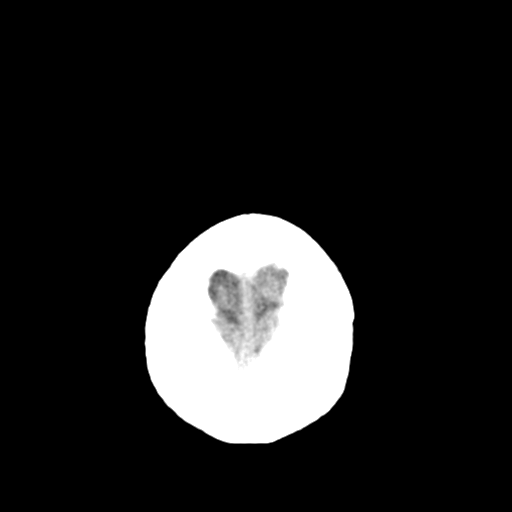

[Series 4: coronal soft tissue · coronal · 0.32mm/px · 3 of 55 slices shown]
[im 19/55  brain]
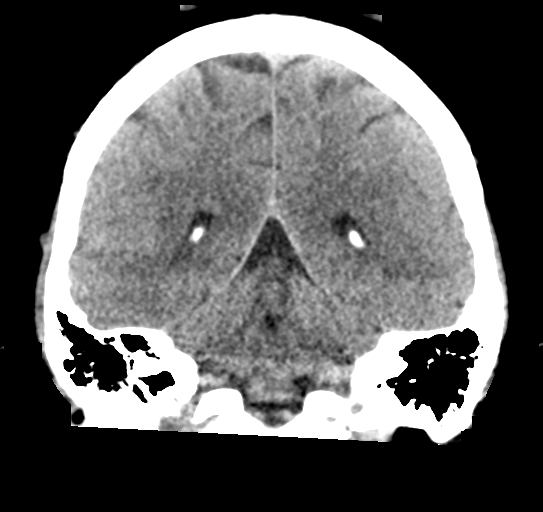
[im 25/55  brain]
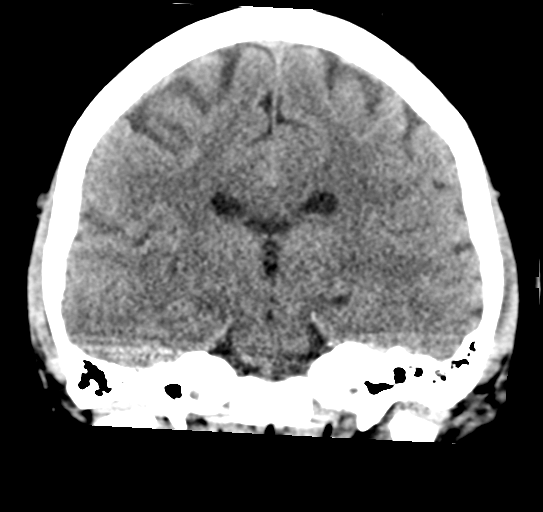
[im 31/55  brain]
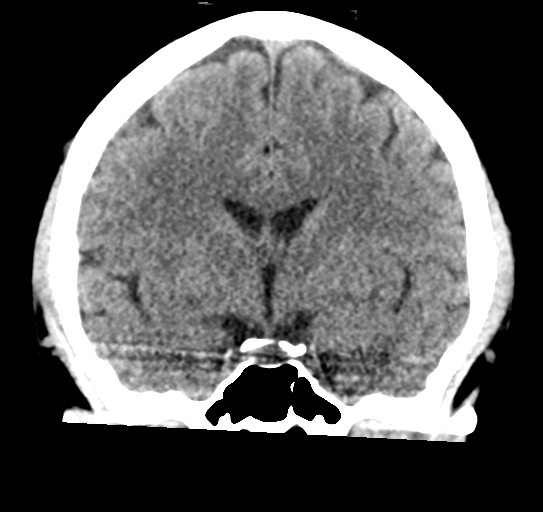

[Series 5: sagittal soft tissue · sagittal · 0.33mm/px · 3 of 53 slices shown]
[im 18/53  brain]
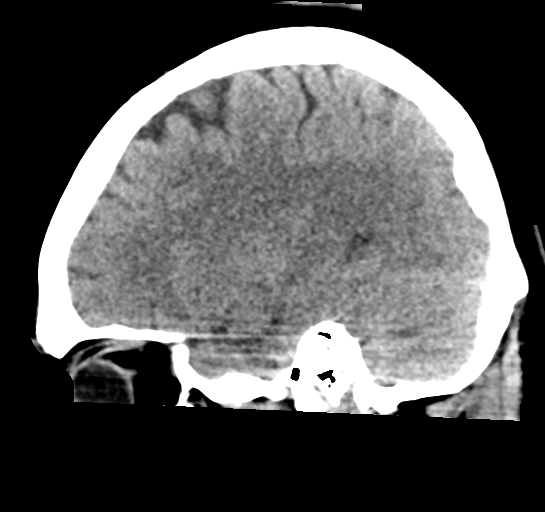
[im 27/53  brain]
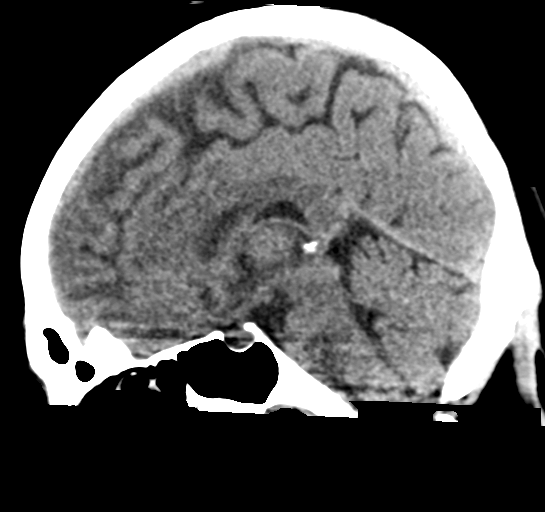
[im 35/53  brain]
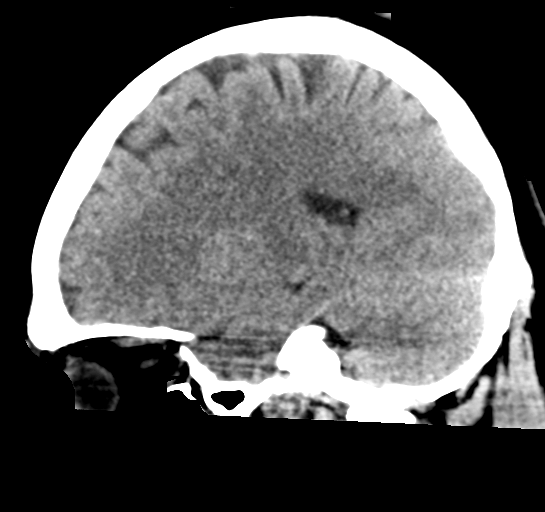

[16 of 45 positions shown; findings below may reference images not displayed]

FINDINGS: Brain: No evidence of acute infarction, hemorrhage, hydrocephalus,
extra-axial collection or mass lesion/mass effect.

Vascular: No hyperdense vessel or unexpected calcification.

Skull: Normal. Negative for fracture or focal lesion.

Sinuses/Orbits: No acute finding.

Other: None.
IMPRESSION: Negative CT of the head.

By: Nomasibulele Moatshe M.D.

## 2019-05-05 ENCOUNTER — Encounter (HOSPITAL_COMMUNITY): Payer: Self-pay | Admitting: Emergency Medicine

## 2019-05-05 ENCOUNTER — Other Ambulatory Visit: Payer: Self-pay

## 2019-05-05 ENCOUNTER — Emergency Department (HOSPITAL_COMMUNITY): Payer: Self-pay

## 2019-05-05 ENCOUNTER — Emergency Department (HOSPITAL_COMMUNITY)
Admission: EM | Admit: 2019-05-05 | Discharge: 2019-05-05 | Disposition: A | Discharge status: Home / Self Care | Payer: Self-pay | Attending: Emergency Medicine | Admitting: Emergency Medicine

## 2019-05-05 DIAGNOSIS — N938 Other specified abnormal uterine and vaginal bleeding: Secondary | ICD-10-CM | POA: Insufficient documentation

## 2019-05-05 DIAGNOSIS — R102 Pelvic and perineal pain: Secondary | ICD-10-CM | POA: Insufficient documentation

## 2019-05-05 DIAGNOSIS — Z975 Presence of (intrauterine) contraceptive device: Secondary | ICD-10-CM | POA: Insufficient documentation

## 2019-05-05 LAB — URINALYSIS, ROUTINE W REFLEX MICROSCOPIC
Bilirubin Urine: NEGATIVE
Glucose, UA: NEGATIVE mg/dL
Ketones, ur: NEGATIVE mg/dL
Leukocytes,Ua: NEGATIVE
Nitrite: NEGATIVE
Protein, ur: NEGATIVE mg/dL
Specific Gravity, Urine: 1.027 (ref 1.005–1.030)
pH: 5 (ref 5.0–8.0)

## 2019-05-05 LAB — CBC WITH DIFFERENTIAL/PLATELET
Abs Immature Granulocytes: 0.02 10*3/uL (ref 0.00–0.07)
Basophils Absolute: 0.1 10*3/uL (ref 0.0–0.1)
Basophils Relative: 1 %
Eosinophils Absolute: 0.1 10*3/uL (ref 0.0–0.5)
Eosinophils Relative: 2 %
HCT: 40.2 % (ref 36.0–46.0)
Hemoglobin: 13.4 g/dL (ref 12.0–15.0)
Immature Granulocytes: 0 %
Lymphocytes Relative: 34 %
Lymphs Abs: 2.5 10*3/uL (ref 0.7–4.0)
MCH: 30.8 pg (ref 26.0–34.0)
MCHC: 33.3 g/dL (ref 30.0–36.0)
MCV: 92.4 fL (ref 80.0–100.0)
Monocytes Absolute: 0.5 10*3/uL (ref 0.1–1.0)
Monocytes Relative: 7 %
Neutro Abs: 4.2 10*3/uL (ref 1.7–7.7)
Neutrophils Relative %: 56 %
Platelets: 232 10*3/uL (ref 150–400)
RBC: 4.35 MIL/uL (ref 3.87–5.11)
RDW: 11.7 % (ref 11.5–15.5)
WBC: 7.4 10*3/uL (ref 4.0–10.5)
nRBC: 0 % (ref 0.0–0.2)

## 2019-05-05 LAB — I-STAT BETA HCG BLOOD, ED (MC, WL, AP ONLY): I-stat hCG, quantitative: <5 m[IU]/mL (ref <5)

## 2019-05-05 LAB — BASIC METABOLIC PANEL
Anion gap: 8 (ref 5–15)
BUN: 8 mg/dL (ref 6–20)
CO2: 23 mmol/L (ref 22–32)
Calcium: 9.1 mg/dL (ref 8.9–10.3)
Chloride: 110 mmol/L (ref 98–111)
Creatinine, Ser: 0.73 mg/dL (ref 0.44–1.00)
GFR calc Af Amer: >60 mL/min (ref >60)
GFR calc non Af Amer: >60 mL/min (ref >60)
Glucose, Bld: 106 mg/dL — ABNORMAL HIGH (ref 70–99)
Potassium: 3.4 mmol/L — ABNORMAL LOW (ref 3.5–5.1)
Sodium: 141 mmol/L (ref 135–145)

## 2019-05-05 LAB — WET PREP, GENITAL
Clue Cells Wet Prep HPF POC: NONE SEEN
Sperm: NONE SEEN
Trich, Wet Prep: NONE SEEN
Yeast Wet Prep HPF POC: NONE SEEN

## 2019-05-05 LAB — HIV ANTIBODY (ROUTINE TESTING W REFLEX): HIV Screen 4th Generation wRfx: NONREACTIVE

## 2019-05-05 MED ORDER — LIDOCAINE HCL (PF) 1 % IJ SOLN
INTRAMUSCULAR | Status: AC
  Filled 2019-05-05: qty 5

## 2019-05-05 MED ORDER — CEFTRIAXONE SODIUM 250 MG IJ SOLR
250.0000 mg | Freq: Once | INTRAMUSCULAR | Status: AC
  Administered 2019-05-05: 250 mg via INTRAMUSCULAR
  Filled 2019-05-05: qty 250

## 2019-05-05 MED ORDER — AZITHROMYCIN 250 MG PO TABS
1000.0000 mg | ORAL_TABLET | Freq: Once | ORAL | Status: AC
  Administered 2019-05-05: 1000 mg via ORAL
  Filled 2019-05-05: qty 4

## 2019-05-05 NOTE — ED Notes (Signed)
Patient transported to Ultrasound 

## 2019-05-05 NOTE — ED Notes (Signed)
Patient verbalizes understanding of discharge instructions. Opportunity for questioning and answers were provided. Armband removed by staff, pt discharged from ED ambulatory w/ sig other 

## 2019-05-05 NOTE — ED Provider Notes (Signed)
[facility] EMERGENCY DEPARTMENT Provider Note   CSN: [id number] Arrival date & time: 05/05/19  1831     History   Chief Complaint Chief Complaint  Patient presents with   Vaginal Bleeding    HPI Alexis Horne is a 28 y.o. female.     The history is provided by the patient. No language interpreter was used.  Vaginal Bleeding    27 year old female presenting for evaluation of vaginal bleeding.  Patient reports since 8 AM today she has been having persistent vaginal bleeding.  States she has gone through approximately 5 pads.  She also endorsed lower abdominal cramping associate with this pain.  She rates her pain a 7 out of 10, persistent.  No associated fever chills no vaginal discharge no dysuria no back pain.  She has a Mirena IUD placed a year ago and states she has not had a menstrual period in 2 years since the birth of her child.  She did try to follow-up with OB/GYN but states her next appointment is in 2 weeks.  She mentioned history of endometriosis.  Denies history of ovarian cyst or uterine fibroid.  No specific treatment tried at home.  Denies lightheadedness or dizziness fever chills.  Past Medical History:  Diagnosis Date   Deaf, left    Obesity     Patient Active Problem List   Diagnosis Date Noted   Postpartum care following vaginal delivery 03/12/2018   BMI 40.0-44.9, adult (HCC) 08/19/2017    Past Surgical History:  Procedure Laterality Date   NO PAST SURGERIES       OB History    Gravida  1   Para  1   Term  1   Preterm      AB      Living  1     SAB      TAB      Ectopic      Multiple  0   Live Births  1            Home Medications    Prior to Admission medications   Medication Sig Start Date End Date Taking? Authorizing Provider  baclofen (LIORESAL) 10 MG tablet Take 1 tablet (10 mg total) by mouth 3 (three) times daily. 06/14/18 06/14/19  Fisher, Roselyn Bering, PA-C  cetirizine (ZYRTEC) 10 MG  tablet Take 1 tablet by mouth as needed. 05/16/18 06/15/18  [provider]  cyclobenzaprine (FLEXERIL) 10 MG tablet Take 1 tablet (10 mg total) by mouth 3 (three) times daily as needed for muscle spasms. 06/22/18   Triplett, Cari B, FNP  fluticasone (FLONASE) 50 MCG/ACT nasal spray Place 2 sprays into both nostrils as needed. 05/16/18 05/16/19  [provider]  levonorgestrel (MIRENA) 20 MCG/24HR IUD 1 each by Intrauterine route once.    [provider]  ondansetron (ZOFRAN ODT) 4 MG disintegrating tablet Take 1 tablet (4 mg total) by mouth every 8 (eight) hours as needed for nausea or vomiting. 07/27/18   Rockne Menghini, MD  predniSONE (STERAPRED UNI-PAK 21 TAB) 10 MG (21) TBPK tablet Take 6 pills on day one then decrease by 1 pill each day 06/14/18   Faythe Ghee, PA-C    Family History Family History  Problem Relation Age of Onset   Hypertension Mother    Diabetes Mother    Cancer Father 69       colon   Congestive Heart Failure Father    Hypertension Father    Diabetes  Father     Social History Social History   Tobacco Use   Smoking status: Never Smoker   Smokeless tobacco: Never Used  Substance Use Topics   Alcohol use: No   Drug use: No     Allergies   Ibuprofen and Kiwi extract   Review of Systems Review of Systems  Genitourinary: Positive for vaginal bleeding.  All other systems reviewed and are negative.    Physical Exam Updated Vital Signs BP 140/60 (BP Location: Right Arm)    Pulse 89    Temp 98.1 F (36.7 C) (Oral)    Resp 16    SpO2 99%   Physical Exam Vitals signs and nursing note reviewed.  Constitutional:      General: She is not in acute distress.    Appearance: She is well-developed. She is obese.  HENT:     Head: Atraumatic.  Eyes:     Conjunctiva/sclera: Conjunctivae normal.  Neck:     Musculoskeletal: Neck supple.  Cardiovascular:     Rate and Rhythm: Normal rate and regular rhythm.      Pulses: Normal pulses.     Heart sounds: Normal heart sounds.  Pulmonary:     Effort: Pulmonary effort is normal.  Abdominal:     Palpations: Abdomen is soft.  Genitourinary:    Comments: Chaperone present during exam.  No inguinal lymphadenopathy or inguinal hernia noted.  Discomfort with speculum insertion.  Vaginal bleeding noted in vaginal vault without foreign body noted.  IUD string appears to be in place.  There white discharge oozing out from a small 2 mm laceration at the cervical os.  On bimanual examination cervical motion tenderness elicited. Skin:    Findings: No rash.  Neurological:     Mental Status: She is alert.      ED Treatments / Results  Labs (all labs ordered are listed, but only abnormal results are displayed) Labs Reviewed  WET PREP, GENITAL - Abnormal; Notable for the following components:      Result Value   WBC, Wet Prep HPF POC MODERATE (*)    All other components within normal limits  URINALYSIS, ROUTINE W REFLEX MICROSCOPIC - Abnormal; Notable for the following components:   Hgb urine dipstick SMALL (*)    Bacteria, UA RARE (*)    All other components within normal limits  BASIC METABOLIC PANEL - Abnormal; Notable for the following components:   Potassium 3.4 (*)    Glucose, Bld 106 (*)    All other components within normal limits  HIV ANTIBODY (ROUTINE TESTING W REFLEX)  CBC WITH DIFFERENTIAL/PLATELET  RPR  HIV4GL SAVE TUBE  I-STAT BETA HCG BLOOD, ED ([facility], [facility], [facility] ONLY)  GC/CHLAMYDIA PROBE AMP ([place]) NOT AT [facility]    EKG None  Radiology [place] Transvaginal Non-ob  Result Date: 05/05/2019 CLINICAL DATA:  Pelvic pain for 1 day. History of intrauterine device. EXAM: TRANSABDOMINAL AND TRANSVAGINAL ULTRASOUND OF PELVIS DOPPLER ULTRASOUND OF OVARIES TECHNIQUE: Both transabdominal and transvaginal ultrasound examinations of the pelvis were performed. Transabdominal technique was performed for global imaging of the pelvis including uterus,  ovaries, adnexal regions, and pelvic cul-de-sac. It was necessary to proceed with endovaginal exam following the transabdominal exam to visualize the uterus, ovaries, and adnexa. Color and duplex Doppler ultrasound was utilized to evaluate blood flow to the ovaries. COMPARISON:  CT of 05/30/2018 FINDINGS: Uterus Measurements: 7.3 x 3.6 x 4.8 cm = volume: 64.0 mL. No fibroids or other mass visualized. Endometrium Thickness: Normal, 10 mm.  Intrauterine device appropriately positioned. Right ovary Measurements: 1.9 x 1.0 x 1.0 cm = volume: 1.1 mL. Small for age, with normal morphology. Left ovary Measurements: 1.9 x 1.8 x 1.7 cm = volume: 2.8 mL. Small for age, with normal morphology. Pulsed Doppler evaluation of both ovaries demonstrates normal low-resistance arterial and venous waveforms. Other findings No abnormal free fluid. IMPRESSION: 1.  No acute process or explanation for pelvic pain. 2. Intrauterine device, appropriately positioned. 3. Small ovaries for age, nonspecific. Electronically Signed   By: Abigail Miyamoto M.D.   On: 05/05/2019 21:33   US Pelvis Complete  Result Date: 05/05/2019 CLINICAL DATA:  Pelvic pain for 1 day. History of intrauterine device. EXAM: TRANSABDOMINAL AND TRANSVAGINAL ULTRASOUND OF PELVIS DOPPLER ULTRASOUND OF OVARIES TECHNIQUE: Both transabdominal and transvaginal ultrasound examinations of the pelvis were performed. Transabdominal technique was performed for global imaging of the pelvis including uterus, ovaries, adnexal regions, and pelvic cul-de-sac. It was necessary to proceed with endovaginal exam following the transabdominal exam to visualize the uterus, ovaries, and adnexa. Color and duplex Doppler ultrasound was utilized to evaluate blood flow to the ovaries. COMPARISON:  CT of 05/30/2018 FINDINGS: Uterus Measurements: 7.3 x 3.6 x 4.8 cm = volume: 64.0 mL. No fibroids or other mass visualized. Endometrium Thickness: Normal, 10 mm. Intrauterine device appropriately  positioned. Right ovary Measurements: 1.9 x 1.0 x 1.0 cm = volume: 1.1 mL. Small for age, with normal morphology. Left ovary Measurements: 1.9 x 1.8 x 1.7 cm = volume: 2.8 mL. Small for age, with normal morphology. Pulsed Doppler evaluation of both ovaries demonstrates normal low-resistance arterial and venous waveforms. Other findings No abnormal free fluid. IMPRESSION: 1.  No acute process or explanation for pelvic pain. 2. Intrauterine device, appropriately positioned. 3. Small ovaries for age, nonspecific. Electronically Signed   By: Abigail Miyamoto M.D.   On: 05/05/2019 21:33   [place] Art/ven Flow Abd Pelv Doppler  Result Date: 05/05/2019 CLINICAL DATA:  Pelvic pain for 1 day. History of intrauterine device. EXAM: TRANSABDOMINAL AND TRANSVAGINAL ULTRASOUND OF PELVIS DOPPLER ULTRASOUND OF OVARIES TECHNIQUE: Both transabdominal and transvaginal ultrasound examinations of the pelvis were performed. Transabdominal technique was performed for global imaging of the pelvis including uterus, ovaries, adnexal regions, and pelvic cul-de-sac. It was necessary to proceed with endovaginal exam following the transabdominal exam to visualize the uterus, ovaries, and adnexa. Color and duplex Doppler ultrasound was utilized to evaluate blood flow to the ovaries. COMPARISON:  CT of 05/30/2018 FINDINGS: Uterus Measurements: 7.3 x 3.6 x 4.8 cm = volume: 64.0 mL. No fibroids or other mass visualized. Endometrium Thickness: Normal, 10 mm. Intrauterine device appropriately positioned. Right ovary Measurements: 1.9 x 1.0 x 1.0 cm = volume: 1.1 mL. Small for age, with normal morphology. Left ovary Measurements: 1.9 x 1.8 x 1.7 cm = volume: 2.8 mL. Small for age, with normal morphology. Pulsed Doppler evaluation of both ovaries demonstrates normal low-resistance arterial and venous waveforms. Other findings No abnormal free fluid. IMPRESSION: 1.  No acute process or explanation for pelvic pain. 2. Intrauterine device, appropriately  positioned. 3. Small ovaries for age, nonspecific. Electronically Signed   By: Abigail Miyamoto M.D.   On: 05/05/2019 21:33    Procedures Procedures (including critical care time)  Medications Ordered in ED Medications  cefTRIAXone (ROCEPHIN) injection 250 mg (has no administration in time range)  azithromycin (ZITHROMAX) tablet 1,000 mg (has no administration in time range)     Initial Impression / Assessment and Plan / ED Course  I have reviewed  the triage vital signs and the nursing notes.  Pertinent labs & imaging results that were available during my care of the patient were reviewed by me and considered in my medical decision making (see chart for details).        BP 140/60 (BP Location: Right Arm)    Pulse 89    Temp 98.1 F (36.7 C) (Oral)    Resp 16    SpO2 99%    Final Clinical Impressions(s) / ED Diagnoses   Final diagnoses:  Dysfunctional uterine bleeding    ED Discharge Orders    None     8:14 PM Patient here with complaints of new onset of vaginal bleeding after not having menstruation for about 2 years.  She has an IUD.  On exam she does have vaginal bleeding of scant amount however she appears to have a small puncture wound or small laceration at her cervical os oozing out white discharge.  She has cervical motion tenderness on exam therefore will obtain pelvic ultrasound.  Work-up initiated.  9:46 PM UA without signs of urinary tract infection.  Normal WBC, normal H&H, electrolyte panels are reassuring, wet prep shows moderate amount of WBC.  HIV is nonreactive.  Patient given prophylactic Rocephin and Zithromax to cover for potential STI.  Pregnancy test is negative.  Ultrasound of the pelvis without any acute finding.  At this time I encourage patient to take over-the-counter Tylenol or ibuprofen as needed for her pelvic cramping.  She will be notified if test positive for STI.   Fayrene Helper, PA-C 05/05/19 2149    Pricilla Loveless, MD 05/06/19 1125

## 2019-05-05 NOTE — ED Triage Notes (Signed)
Pt states she had an IUD placed in august after having her son- and states since then has not had any vaginal bleeding. Pt today at 0800 she started having bleeding a pelvic pain. Pt states she has changed her pads 4 times today. Pt called her OB GYN and was told she couldn't been seen for 2 weeks.

## 2019-05-05 NOTE — Discharge Instructions (Signed)
Continue to monitor your vaginal bleeding if persists please follow-up with your OB/GYN for further evaluation.  Your hemoglobin is stable today.  Your labs are reassuring.  You have received antibiotics for potential infection causing his symptoms.  Your IUD is in the correct placement.  Return if you have any concern.

## 2019-05-05 NOTE — ED Notes (Signed)
lavendar and light green tubes at lab if needed

## 2019-05-06 LAB — RPR: RPR Ser Ql: NONREACTIVE
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
## 2019-05-09 LAB — GC/CHLAMYDIA PROBE AMP (~~LOC~~) NOT AT ARMC
Chlamydia: NEGATIVE
Neisseria Gonorrhea: NEGATIVE

## 2019-05-23 ENCOUNTER — Ambulatory Visit: Payer: Self-pay | Admitting: Obstetrics and Gynecology

## 2020-02-18 ENCOUNTER — Emergency Department (HOSPITAL_COMMUNITY): Admission: EM | Admit: 2020-02-18 | Discharge: 2020-02-18 | Discharge status: Against Medical Advice | Payer: Self-pay

## 2020-02-18 NOTE — ED Notes (Signed)
Pt and her husband walk out said they can not wait  Pt left with out being triaged

## 2020-02-19 ENCOUNTER — Encounter: Payer: Self-pay | Admitting: Emergency Medicine

## 2020-02-19 ENCOUNTER — Other Ambulatory Visit: Payer: Self-pay

## 2020-02-19 ENCOUNTER — Emergency Department
Admission: EM | Admit: 2020-02-19 | Discharge: 2020-02-19 | Disposition: A | Discharge status: Home / Self Care | Payer: Self-pay | Attending: Emergency Medicine | Admitting: Emergency Medicine

## 2020-02-19 ENCOUNTER — Emergency Department: Payer: Self-pay

## 2020-02-19 DIAGNOSIS — R072 Precordial pain: Secondary | ICD-10-CM | POA: Insufficient documentation

## 2020-02-19 DIAGNOSIS — Z5321 Procedure and treatment not carried out due to patient leaving prior to being seen by health care provider: Secondary | ICD-10-CM | POA: Insufficient documentation

## 2020-02-19 DIAGNOSIS — R112 Nausea with vomiting, unspecified: Secondary | ICD-10-CM | POA: Insufficient documentation

## 2020-02-19 LAB — TROPONIN I (HIGH SENSITIVITY): Troponin I (High Sensitivity): 3 ng/L (ref <18)

## 2020-02-19 LAB — CBC
HCT: 43.3 % (ref 36.0–46.0)
Hemoglobin: 15 g/dL (ref 12.0–15.0)
MCH: 30.5 pg (ref 26.0–34.0)
MCHC: 34.6 g/dL (ref 30.0–36.0)
MCV: 88 fL (ref 80.0–100.0)
Platelets: 225 10*3/uL (ref 150–400)
RBC: 4.92 MIL/uL (ref 3.87–5.11)
RDW: 11.9 % (ref 11.5–15.5)
WBC: 5.4 10*3/uL (ref 4.0–10.5)
nRBC: 0 % (ref 0.0–0.2)

## 2020-02-19 LAB — BASIC METABOLIC PANEL
Anion gap: 9 (ref 5–15)
BUN: 7 mg/dL (ref 6–20)
CO2: 23 mmol/L (ref 22–32)
Calcium: 8.8 mg/dL — ABNORMAL LOW (ref 8.9–10.3)
Chloride: 105 mmol/L (ref 98–111)
Creatinine, Ser: 0.71 mg/dL (ref 0.44–1.00)
GFR calc Af Amer: >60 mL/min (ref >60)
GFR calc non Af Amer: >60 mL/min (ref >60)
Glucose, Bld: 117 mg/dL — ABNORMAL HIGH (ref 70–99)
Potassium: 3.2 mmol/L — ABNORMAL LOW (ref 3.5–5.1)
Sodium: 137 mmol/L (ref 135–145)

## 2020-02-19 MED ORDER — SODIUM CHLORIDE 0.9% FLUSH: 3.0000 mL | Freq: Once | INTRAVENOUS | Status: DC

## 2020-02-19 NOTE — ED Triage Notes (Signed)
Pt presents to ED via POV with c/o central substernal CP. Pt states seen at [facility] last night and [facility] today however left because of wait times. Pt also c/o nausea at this time.   Pt states approx 4 episodes of emesis in 24 hrs.

## 2020-02-28 ENCOUNTER — Emergency Department: Payer: HRSA Program

## 2020-02-28 ENCOUNTER — Other Ambulatory Visit: Payer: Self-pay

## 2020-02-28 ENCOUNTER — Encounter: Payer: Self-pay | Admitting: Emergency Medicine

## 2020-02-28 ENCOUNTER — Inpatient Hospital Stay: Payer: HRSA Program

## 2020-02-28 ENCOUNTER — Inpatient Hospital Stay
Admission: EM | Admit: 2020-02-28 | Discharge: 2020-03-04 | DRG: 177 | Disposition: A | Discharge status: Home / Self Care | Payer: HRSA Program | Attending: Internal Medicine | Admitting: Internal Medicine

## 2020-02-28 DIAGNOSIS — Z975 Presence of (intrauterine) contraceptive device: Secondary | ICD-10-CM | POA: Diagnosis not present

## 2020-02-28 DIAGNOSIS — Z886 Allergy status to analgesic agent status: Secondary | ICD-10-CM

## 2020-02-28 DIAGNOSIS — H918X2 Other specified hearing loss, left ear: Secondary | ICD-10-CM | POA: Diagnosis present

## 2020-02-28 DIAGNOSIS — J9601 Acute respiratory failure with hypoxia: Secondary | ICD-10-CM | POA: Diagnosis present

## 2020-02-28 DIAGNOSIS — Z79899 Other long term (current) drug therapy: Secondary | ICD-10-CM | POA: Diagnosis not present

## 2020-02-28 DIAGNOSIS — J1282 Pneumonia due to coronavirus disease 2019: Secondary | ICD-10-CM | POA: Diagnosis present

## 2020-02-28 DIAGNOSIS — E876 Hypokalemia: Secondary | ICD-10-CM | POA: Diagnosis present

## 2020-02-28 DIAGNOSIS — Z6841 Body Mass Index (BMI) 40.0 and over, adult: Secondary | ICD-10-CM

## 2020-02-28 DIAGNOSIS — I959 Hypotension, unspecified: Secondary | ICD-10-CM | POA: Diagnosis present

## 2020-02-28 DIAGNOSIS — Z91018 Allergy to other foods: Secondary | ICD-10-CM

## 2020-02-28 DIAGNOSIS — Z283 Underimmunization status: Secondary | ICD-10-CM

## 2020-02-28 DIAGNOSIS — U071 COVID-19: Secondary | ICD-10-CM | POA: Diagnosis present

## 2020-02-28 DIAGNOSIS — K5289 Other specified noninfective gastroenteritis and colitis: Secondary | ICD-10-CM | POA: Diagnosis present

## 2020-02-28 LAB — CBC WITH DIFFERENTIAL/PLATELET
Abs Immature Granulocytes: 0.03 10*3/uL (ref 0.00–0.07)
Basophils Absolute: 0 10*3/uL (ref 0.0–0.1)
Basophils Relative: 0 %
Eosinophils Absolute: 0 10*3/uL (ref 0.0–0.5)
Eosinophils Relative: 0 %
HCT: 42.7 % (ref 36.0–46.0)
Hemoglobin: 14.7 g/dL (ref 12.0–15.0)
Immature Granulocytes: 1 %
Lymphocytes Relative: 21 %
Lymphs Abs: 1.1 10*3/uL (ref 0.7–4.0)
MCH: 30.4 pg (ref 26.0–34.0)
MCHC: 34.4 g/dL (ref 30.0–36.0)
MCV: 88.4 fL (ref 80.0–100.0)
Monocytes Absolute: 0.3 10*3/uL (ref 0.1–1.0)
Monocytes Relative: 6 %
Neutro Abs: 3.8 10*3/uL (ref 1.7–7.7)
Neutrophils Relative %: 72 %
Platelets: 262 10*3/uL (ref 150–400)
RBC: 4.83 MIL/uL (ref 3.87–5.11)
RDW: 12.2 % (ref 11.5–15.5)
Smear Review: NORMAL
WBC: 5.3 10*3/uL (ref 4.0–10.5)
nRBC: 0 % (ref 0.0–0.2)

## 2020-02-28 LAB — LACTATE DEHYDROGENASE: LDH: 443 U/L — ABNORMAL HIGH (ref 98–192)

## 2020-02-28 LAB — COMPREHENSIVE METABOLIC PANEL
ALT: 24 U/L (ref 0–44)
AST: 63 U/L — ABNORMAL HIGH (ref 15–41)
Albumin: 3.5 g/dL (ref 3.5–5.0)
Alkaline Phosphatase: 53 U/L (ref 38–126)
Anion gap: 13 (ref 5–15)
BUN: 8 mg/dL (ref 6–20)
CO2: 24 mmol/L (ref 22–32)
Calcium: 8.6 mg/dL — ABNORMAL LOW (ref 8.9–10.3)
Chloride: 101 mmol/L (ref 98–111)
Creatinine, Ser: 0.72 mg/dL (ref 0.44–1.00)
GFR calc Af Amer: >60 mL/min (ref >60)
GFR calc non Af Amer: >60 mL/min (ref >60)
Glucose, Bld: 132 mg/dL — ABNORMAL HIGH (ref 70–99)
Potassium: 2.7 mmol/L — CL (ref 3.5–5.1)
Sodium: 138 mmol/L (ref 135–145)
Total Bilirubin: 0.7 mg/dL (ref 0.3–1.2)
Total Protein: 7.7 g/dL (ref 6.5–8.1)

## 2020-02-28 LAB — URINALYSIS, COMPLETE (UACMP) WITH MICROSCOPIC
Bacteria, UA: NONE SEEN
Bilirubin Urine: NEGATIVE
Glucose, UA: NEGATIVE mg/dL
Ketones, ur: NEGATIVE mg/dL
Nitrite: NEGATIVE
Protein, ur: 100 mg/dL — AB
RBC / HPF: >50 RBC/hpf — ABNORMAL HIGH (ref 0–5)
Specific Gravity, Urine: >1.046 — ABNORMAL HIGH (ref 1.005–1.030)
WBC, UA: >50 WBC/hpf — ABNORMAL HIGH (ref 0–5)
pH: 5 (ref 5.0–8.0)

## 2020-02-28 LAB — PREGNANCY, URINE: Preg Test, Ur: NEGATIVE

## 2020-02-28 LAB — FIBRINOGEN: Fibrinogen: 578 mg/dL — ABNORMAL HIGH (ref 210–475)

## 2020-02-28 LAB — SARS CORONAVIRUS 2 BY RT PCR (HOSPITAL ORDER, PERFORMED IN ~~LOC~~ HOSPITAL LAB): SARS Coronavirus 2: POSITIVE — AB

## 2020-02-28 LAB — PROCALCITONIN: Procalcitonin: <0.1 ng/mL

## 2020-02-28 LAB — LACTIC ACID, PLASMA
Lactic Acid, Venous: 1.5 mmol/L (ref 0.5–1.9)
Lactic Acid, Venous: 1.7 mmol/L (ref 0.5–1.9)

## 2020-02-28 LAB — TRIGLYCERIDES: Triglycerides: 197 mg/dL — ABNORMAL HIGH (ref <150)

## 2020-02-28 LAB — FIBRIN DERIVATIVES D-DIMER (ARMC ONLY): Fibrin derivatives D-dimer (ARMC): 756.27 ng/mL (FEU) — ABNORMAL HIGH (ref 0.00–499.00)

## 2020-02-28 LAB — FERRITIN: Ferritin: 423 ng/mL — ABNORMAL HIGH (ref 11–307)

## 2020-02-28 LAB — C-REACTIVE PROTEIN: CRP: 9.1 mg/dL — ABNORMAL HIGH (ref <1.0)

## 2020-02-28 MED ORDER — ENOXAPARIN SODIUM 60 MG/0.6ML ~~LOC~~ SOLN
50.0000 mg | SUBCUTANEOUS | Status: DC
  Administered 2020-02-28 – 2020-03-03 (×5): 50 mg via SUBCUTANEOUS
  Filled 2020-02-28 (×6): qty 0.6

## 2020-02-28 MED ORDER — SODIUM CHLORIDE 0.9 % IV BOLUS
1000.0000 mL | Freq: Once | INTRAVENOUS | Status: AC
  Administered 2020-02-28: 1000 mL via INTRAVENOUS

## 2020-02-28 MED ORDER — ASCORBIC ACID 500 MG PO TABS
500.0000 mg | ORAL_TABLET | Freq: Every day | ORAL | Status: DC
  Administered 2020-02-28 – 2020-03-04 (×6): 500 mg via ORAL
  Filled 2020-02-28 (×6): qty 1

## 2020-02-28 MED ORDER — ZINC SULFATE 220 (50 ZN) MG PO CAPS
220.0000 mg | ORAL_CAPSULE | Freq: Every day | ORAL | Status: DC
  Administered 2020-02-28 – 2020-03-04 (×6): 220 mg via ORAL
  Filled 2020-02-28 (×6): qty 1

## 2020-02-28 MED ORDER — SODIUM CHLORIDE 0.9 % IV SOLN
200.0000 mg | Freq: Once | INTRAVENOUS | Status: AC
  Administered 2020-02-28: 200 mg via INTRAVENOUS
  Filled 2020-02-28: qty 200

## 2020-02-28 MED ORDER — ALUM & MAG HYDROXIDE-SIMETH 200-200-20 MG/5ML PO SUSP: 15.0000 mL | Freq: Four times a day (QID) | ORAL | Status: DC | PRN

## 2020-02-28 MED ORDER — ONDANSETRON HCL 4 MG/2ML IJ SOLN: 4.0000 mg | Freq: Four times a day (QID) | INTRAMUSCULAR | Status: DC | PRN

## 2020-02-28 MED ORDER — IOHEXOL 350 MG/ML SOLN
75.0000 mL | Freq: Once | INTRAVENOUS | Status: AC | PRN
  Administered 2020-02-28: 75 mL via INTRAVENOUS

## 2020-02-28 MED ORDER — DOCUSATE SODIUM 100 MG PO CAPS
100.0000 mg | ORAL_CAPSULE | Freq: Two times a day (BID) | ORAL | Status: DC | PRN
  Filled 2020-02-28: qty 1

## 2020-02-28 MED ORDER — SODIUM CHLORIDE 0.9 % IV SOLN
100.0000 mg | Freq: Every day | INTRAVENOUS | Status: AC
  Administered 2020-02-29 – 2020-03-03 (×4): 100 mg via INTRAVENOUS
  Filled 2020-02-28 (×2): qty 20
  Filled 2020-02-28 (×2): qty 100
  Filled 2020-02-28: qty 20

## 2020-02-28 MED ORDER — DEXAMETHASONE 4 MG PO TABS
6.0000 mg | ORAL_TABLET | Freq: Every day | ORAL | Status: DC
  Administered 2020-02-29 – 2020-03-04 (×5): 6 mg via ORAL
  Filled 2020-02-28: qty 2
  Filled 2020-02-28 (×2): qty 1.5
  Filled 2020-02-28: qty 2
  Filled 2020-02-28: qty 1.5

## 2020-02-28 MED ORDER — POTASSIUM CHLORIDE CRYS ER 20 MEQ PO TBCR
40.0000 meq | EXTENDED_RELEASE_TABLET | Freq: Once | ORAL | Status: AC
  Administered 2020-02-28: 40 meq via ORAL
  Filled 2020-02-28: qty 2

## 2020-02-28 MED ORDER — CALCIUM CARBONATE ANTACID 500 MG PO CHEW: 1.0000 | CHEWABLE_TABLET | Freq: Three times a day (TID) | ORAL | Status: DC | PRN

## 2020-02-28 MED ORDER — IPRATROPIUM-ALBUTEROL 20-100 MCG/ACT IN AERS
1.0000 | INHALATION_SPRAY | Freq: Four times a day (QID) | RESPIRATORY_TRACT | Status: DC
  Administered 2020-02-28 – 2020-03-04 (×19): 1 via RESPIRATORY_TRACT
  Filled 2020-02-28: qty 4

## 2020-02-28 MED ORDER — DEXAMETHASONE SODIUM PHOSPHATE 10 MG/ML IJ SOLN
8.0000 mg | Freq: Once | INTRAMUSCULAR | Status: AC
  Administered 2020-02-28: 8 mg via INTRAVENOUS
  Filled 2020-02-28: qty 1

## 2020-02-28 MED ORDER — POTASSIUM CHLORIDE 10 MEQ/100ML IV SOLN
10.0000 meq | Freq: Once | INTRAVENOUS | Status: AC
  Administered 2020-02-28: 10 meq via INTRAVENOUS
  Filled 2020-02-28: qty 100

## 2020-02-28 MED ORDER — GUAIFENESIN-DM 100-10 MG/5ML PO SYRP
10.0000 mL | ORAL_SOLUTION | Freq: Four times a day (QID) | ORAL | Status: DC | PRN
  Filled 2020-02-28: qty 10

## 2020-02-28 MED ORDER — HYDROCOD POLST-CPM POLST ER 10-8 MG/5ML PO SUER
5.0000 mL | Freq: Two times a day (BID) | ORAL | Status: DC | PRN
  Administered 2020-02-28 – 2020-03-03 (×6): 5 mL via ORAL
  Filled 2020-02-28 (×7): qty 5

## 2020-02-28 MED ORDER — ENOXAPARIN SODIUM 40 MG/0.4ML ~~LOC~~ SOLN: 40.0000 mg | SUBCUTANEOUS | Status: DC

## 2020-02-28 MED ORDER — GUAIFENESIN-DM 100-10 MG/5ML PO SYRP
10.0000 mL | ORAL_SOLUTION | ORAL | Status: DC | PRN
  Administered 2020-02-28 – 2020-03-02 (×4): 10 mL via ORAL
  Filled 2020-02-28 (×5): qty 10

## 2020-02-28 MED ORDER — ONDANSETRON 4 MG PO TBDP
4.0000 mg | ORAL_TABLET | Freq: Three times a day (TID) | ORAL | Status: DC | PRN
  Filled 2020-02-28: qty 1

## 2020-02-28 MED ORDER — ACETAMINOPHEN 500 MG PO TABS
1000.0000 mg | ORAL_TABLET | Freq: Three times a day (TID) | ORAL | Status: DC | PRN
  Administered 2020-02-29 (×2): 1000 mg via ORAL
  Filled 2020-02-28 (×2): qty 2

## 2020-02-28 MED ORDER — POLYETHYLENE GLYCOL 3350 17 G PO PACK: 17.0000 g | PACK | Freq: Two times a day (BID) | ORAL | Status: DC | PRN

## 2020-02-28 NOTE — ED Notes (Signed)
Informed Dr. Fran Lowes that patient Alexis Horne is ranging between that's fine. goal is >=90%.

## 2020-02-28 NOTE — Progress Notes (Signed)
Remdesivir - Pharmacy Brief Note   Patient tested positive for COVID approximately two weeks ago per report.  O:  ALT: 24 CXR: pending SpO2: 96% on 6L   A/P:  Remdesivir 200 mg IVPB once followed by 100 mg IVPB daily x 4 days.   Laureen Ochs, PharmD 02/28/2020 1:07 PM

## 2020-02-28 NOTE — ED Provider Notes (Signed)
[facility] Emergency Department Provider Note    First MD Initiated Contact with Patient 02/28/20 1228     (approximate)  I have reviewed the triage vital signs and the nursing notes.   HISTORY  Chief Complaint Weakness and Shortness of Breath    HPI Alexis Horne is a 29 y.o. female bolus past medical history presents to the ER for evaluation of she worsening shortness of breath malaise and weakness.  Recently tested positive for Covid.  She did not get her vaccination.  Has not been on any antibiotics.  Arrives to the ER hypoxic and hypotensive.  She endorses decreased p.o. intake.    Past Medical History:  Diagnosis Date  . Deaf, left   . Obesity    Family History  Problem Relation Age of Onset  . Hypertension Mother   . Diabetes Mother   . Cancer Father 64       colon  . Congestive Heart Failure Father   . Hypertension Father   . Diabetes Father    Past Surgical History:  Procedure Laterality Date  . NO PAST SURGERIES     Patient Active Problem List   Diagnosis Date Noted  . Postpartum care following vaginal delivery 03/12/2018  . BMI 40.0-44.9, adult (HCC) 08/19/2017      Prior to Admission medications   Medication Sig Start Date End Date Taking? Authorizing Provider  cetirizine (ZYRTEC) 10 MG tablet Take 1 tablet by mouth as needed. 05/16/18 06/15/18  [provider]  fluticasone (FLONASE) 50 MCG/ACT nasal spray Place 2 sprays into both nostrils as needed. 05/16/18 05/16/19  [provider]  levonorgestrel (MIRENA) 20 MCG/24HR IUD 1 each by Intrauterine route once.    [provider]    Allergies Ibuprofen and Kiwi extract    Social History Social History   Tobacco Use  . Smoking status: Never Smoker  . Smokeless tobacco: Never Used  Vaping Use  . Vaping Use: Never used  Substance Use Topics  . Alcohol use: No  . Drug use: No    Review of Systems Patient denies headaches,  rhinorrhea, blurry vision, numbness, shortness of breath, chest pain, edema, cough, abdominal pain, nausea, vomiting, diarrhea, dysuria, fevers, rashes or hallucinations unless otherwise stated above in HPI. ____________________________________________   PHYSICAL EXAM:  VITAL SIGNS: Vitals:   02/28/20 1124 02/28/20 1133  BP: (!) 77/52   Pulse: 69   Resp: (!) 48   Temp: 98.1 F (36.7 C)   SpO2: 97% 96%    Constitutional: Alert and oriented.  Eyes: Conjunctivae are normal.  Head: Atraumatic. Nose: No congestion/rhinnorhea. Mouth/Throat: Mucous membranes are moist.   Neck: No stridor. Painless ROM.  Cardiovascular: Normal rate, regular rhythm. Grossly normal heart sounds.  Good peripheral circulation. Respiratory: significant tachypnea with use of accessory muscles, inspiratory crackles throughout Gastrointestinal: Soft and nontender. No distention. No abdominal bruits. No CVA tenderness. Genitourinary: deferred Musculoskeletal: No lower extremity tenderness nor edema.  No joint effusions. Neurologic:  Normal speech and language. No gross focal neurologic deficits are appreciated. No facial droop Skin:  Skin is warm, dry and intact. No rash noted. Psychiatric: Mood and affect are normal. Speech and behavior are normal.  ____________________________________________   LABS (all labs ordered are listed, but only abnormal results are displayed)  Results for orders placed or performed during the hospital encounter of 02/28/20 (from the past 24 hour(s))  Lactic acid, plasma     Status: None   Collection Time: 02/28/20 11:43 AM  Result Value Ref Range   Lactic Acid, Venous 1.5 0.5 - 1.9 mmol/L  Comprehensive metabolic panel     Status: Abnormal   Collection Time: 02/28/20 11:43 AM  Result Value Ref Range   Sodium 138 135 - 145 mmol/L   Potassium 2.7 (LL) 3.5 - 5.1 mmol/L   Chloride 101 98 - 111 mmol/L   CO2 24 22 - 32 mmol/L   Glucose, Bld 132 (H) 70 - 99 mg/dL   BUN 8 6 - 20  mg/dL   Creatinine, Ser 3.080.72 0.44 - 1.00 mg/dL   Calcium 8.6 (L) 8.9 - 10.3 mg/dL   Total Protein 7.7 6.5 - 8.1 g/dL   Albumin 3.5 3.5 - 5.0 g/dL   AST 63 (H) 15 - 41 U/L   ALT 24 0 - 44 U/L   Alkaline Phosphatase 53 38 - 126 U/L   Total Bilirubin 0.7 0.3 - 1.2 mg/dL   GFR calc non Af Amer >60 >60 mL/min   GFR calc Af Amer >60 >60 mL/min   Anion gap 13 5 - 15  CBC with Differential     Status: None   Collection Time: 02/28/20 11:43 AM  Result Value Ref Range   WBC 5.3 4.0 - 10.5 K/uL   RBC 4.83 3.87 - 5.11 MIL/uL   Hemoglobin 14.7 12.0 - 15.0 g/dL   HCT 65.742.7 36 - 46 %   MCV 88.4 80.0 - 100.0 fL   MCH 30.4 26.0 - 34.0 pg   MCHC 34.4 30.0 - 36.0 g/dL   RDW 84.612.2 96.211.5 - 95.215.5 %   Platelets 262 150 - 400 K/uL   nRBC 0.0 0.0 - 0.2 %   Neutrophils Relative % 72 %   Neutro Abs 3.8 1.7 - 7.7 K/uL   Lymphocytes Relative 21 %   Lymphs Abs 1.1 0.7 - 4.0 K/uL   Monocytes Relative 6 %   Monocytes Absolute 0.3 0 - 1 K/uL   Eosinophils Relative 0 %   Eosinophils Absolute 0.0 0 - 0 K/uL   Basophils Relative 0 %   Basophils Absolute 0.0 0 - 0 K/uL   WBC Morphology MORPHOLOGY UNREMARKABLE    RBC Morphology MORPHOLOGY UNREMARKABLE    Smear Review Normal platelet morphology    Immature Granulocytes 1 %   Abs Immature Granulocytes 0.03 0.00 - 0.07 K/uL  Triglycerides     Status: Abnormal   Collection Time: 02/28/20  1:30 PM  Result Value Ref Range   Triglycerides 197 (H) <150 mg/dL  Lactic acid, plasma     Status: None   Collection Time: 02/28/20  1:39 PM  Result Value Ref Range   Lactic Acid, Venous 1.7 0.5 - 1.9 mmol/L  Fibrin derivatives D-Dimer     Status: Abnormal   Collection Time: 02/28/20  1:39 PM  Result Value Ref Range   Fibrin derivatives D-dimer ([facility]) 756.27 (H) 0.00 - 499.00 ng/mL (FEU)  Lactate dehydrogenase     Status: Abnormal   Collection Time: 02/28/20  1:39 PM  Result Value Ref Range   LDH 443 (H) 98 - 192 U/L  Ferritin     Status: Abnormal   Collection Time:  02/28/20  1:39 PM  Result Value Ref Range   Ferritin 423 (H) 11 - 307 ng/mL  Fibrinogen     Status: Abnormal   Collection Time: 02/28/20  1:39 PM  Result Value Ref Range   Fibrinogen 578 (H) 210 - 475 mg/dL  Urinalysis, Complete w Microscopic     Status:  Abnormal   Collection Time: 02/28/20  1:41 PM  Result Value Ref Range   Color, Urine AMBER (A) YELLOW   APPearance CLOUDY (A) CLEAR   Specific Gravity, Urine >1.046 (H) 1.005 - 1.030   pH 5.0 5.0 - 8.0   Glucose, UA NEGATIVE NEGATIVE mg/dL   Hgb urine dipstick LARGE (A) NEGATIVE   Bilirubin Urine NEGATIVE NEGATIVE   Ketones, ur NEGATIVE NEGATIVE mg/dL   Protein, ur 702 (A) NEGATIVE mg/dL   Nitrite NEGATIVE NEGATIVE   Leukocytes,Ua TRACE (A) NEGATIVE   RBC / HPF >50 (H) 0 - 5 RBC/hpf   WBC, UA >50 (H) 0 - 5 WBC/hpf   Bacteria, UA NONE SEEN NONE SEEN   Squamous Epithelial / LPF 11-20 0 - 5   WBC Clumps PRESENT    Mucus PRESENT    ____________________________________________  EKG My review and personal interpretation at Time: 11:27   Indication: cob  Rate: 80  Rhythm: sinus Axis: normal Other: nonspecific t wave abn ____________________________________________  RADIOLOGY  I personally reviewed all radiographic images ordered to evaluate for the above acute complaints and reviewed radiology reports and findings.  These findings were personally discussed with the patient.  Please see medical record for radiology report.  ____________________________________________   PROCEDURES  Procedure(s) performed:  .Critical Care Performed by: Willy Eddy, MD Authorized by: Willy Eddy, MD   Critical care provider statement:    Critical care time (minutes):  35   Critical care time was exclusive of:  Separately billable procedures and treating other patients   Critical care was necessary to treat or prevent imminent or life-threatening deterioration of the following conditions:  Respiratory failure   Critical care was  time spent personally by me on the following activities:  Development of treatment plan with patient or surrogate, discussions with consultants, evaluation of patient's response to treatment, examination of patient, obtaining history from patient or surrogate, ordering and performing treatments and interventions, ordering and review of laboratory studies, ordering and review of radiographic studies, pulse oximetry, re-evaluation of patient's condition and review of old charts      Critical Care performed: yes ____________________________________________   INITIAL IMPRESSION / ASSESSMENT AND PLAN / ED COURSE  Pertinent labs & imaging results that were available during my care of the patient were reviewed by me and considered in my medical decision making (see chart for details).   DDX: covid, Asthma, copd, CHF, pna, ptx, malignancy, Pe, anemia   Inita E Santy is a 29 y.o. who presents to the ED with symptoms as described above. Patient with evidence of acute respiratory failure with hypoxia with chest x-ray findings concerning for diffuse multifocal pneumonia likely secondary to COVID-19 pneumonitis. She was on nasal cannula very tachypneic initially was fairly hypotensive but likely secondary to hypovolemia as she is not been eating or drinking much for the past several days given this illness. Have ordered IVF.  Will order Decadron as well as remdesivir. Given the duration of her symptoms will order CTA to exclude PE. Patient will require hospitalization.     The patient was evaluated in Emergency Department today for the symptoms described in the history of present illness. He/she was evaluated in the context of the global COVID-19 pandemic, which necessitated consideration that the patient might be at risk for infection with the SARS-CoV-2 virus that causes COVID-19. Institutional protocols and algorithms that pertain to the evaluation of patients at risk for COVID-19 are in a state of  rapid change based on information released  by regulatory bodies including the CDC and federal and state organizations. These policies and algorithms were followed during the patient's care in the ED.  As part of my medical decision making, I reviewed the following data within the electronic MEDICAL RECORD NUMBER Nursing notes reviewed and incorporated, Labs reviewed, notes from prior ED visits and [place] Controlled Substance Database   ____________________________________________   FINAL CLINICAL IMPRESSION(S) / ED DIAGNOSES  Final diagnoses:  Acute respiratory failure with hypoxia (HCC)  Pneumonia due to COVID-19 virus      NEW MEDICATIONS STARTED DURING THIS VISIT:  New Prescriptions   No medications on file     Note:  This document was prepared using Dragon voice recognition software and may include unintentional dictation errors.    Willy Eddy, MD 02/28/20 1520

## 2020-02-28 NOTE — H&P (Signed)
History and Physical    Alexis Horne KVQ:[id number] DOB: 10/28/1990 DOA: 02/28/2020  PCP: Patient, No Pcp Per  Patient coming from: home  I have personally briefly reviewed patient's old medical records in [facility]  Chief Complaint: dyspnea and cough  HPI: Alexis Horne is a 29 y.o. female with medical history significant of obesity who presented with dyspnea and cough after a COVID-19 dx.   Pt reported a dx of COVID-19 infection about 2 weeks ago.  About a week ago, pt started having dyspnea, cough productive of blood-streaked sputum, N/V/D.  Pt also reported fever at home.  Pt was not vaccinated.  Pt said she went to a hospital in [facility] while she was visiting her mother and was discharged home then.  Pt has no medical problems at baseline.  ED Course: initial vitals: afebrile, pulse 69, BP 77/52, triage noted O2 sats 82% on RA, which increased to 97% on 6L.  Labs notable for normal WBC, K+ 2.7, lactic acid wnl, procal neg, CXR showed "Extensive airspace opacity throughout the lungs bilaterally."  Pt received 1L NS, Remdesivir, IV decadron, potassium in the ED before admission.   Assessment/Plan Active Problems:   Pneumonia due to COVID-19 virus  Acute hypoxic respiratory failure 2/2 COVID-19 multi-focal PNA --On presentation, O2 sats 82% on RA, which increased to 97% on 6L. --CXR showed "Extensive airspace opacity throughout the lungs bilaterally." PLAN: --continue Remdesivir --continue decadron, will increase to solu-medrol if CRP returns significantly elevated --Combivent q6h scheduled --vit and zinc --cough meds --incentive spirometry and flutter valve --continue suppl O2 to maintain sat >=90%  # N/V/D due to COVID-19 gastroenteritis --s/p 1L NS PLAN: --supportive care --regular diet as tolerated  # Hypotension --BP 77/52 on presentation, likely due to volume depletion from N/V/D --s/p 1L NS PLAN: --order 2nd liter of NS  # Hypokalemia  2/2 GI loss --recheck and replete PRN    DVT prophylaxis: Lovenox SQ Code Status: Full code  Family Communication:   Disposition Plan: home  Consults called: none Admission status: Inpatient   Review of Systems: As per HPI otherwise 10 point review of systems negative.   Past Medical History:  Diagnosis Date  . Deaf, left   . Obesity     Past Surgical History:  Procedure Laterality Date  . NO PAST SURGERIES       reports that she has never smoked. She has never used smokeless tobacco. She reports that she does not drink alcohol and does not use drugs.  Allergies  Allergen Reactions  . Ibuprofen Swelling  . Kiwi Extract     Swollen and itchy    Family History  Problem Relation Age of Onset  . Hypertension Mother   . Diabetes Mother   . Cancer Father 28       colon  . Congestive Heart Failure Father   . Hypertension Father   . Diabetes Father      Prior to Admission medications   Medication Sig Start Date End Date Taking? Authorizing Provider  fluticasone (FLONASE) 50 MCG/ACT nasal spray Place 2 sprays into both nostrils as needed. 05/16/18 02/28/20 Yes [provider]  KLOR-CON M20 20 MEQ tablet Take 20 mEq by mouth 2 (two) times daily. 02/23/20  Yes [provider]  levonorgestrel (MIRENA) 20 MCG/24HR IUD 1 each by Intrauterine route once.   Yes [provider]  ondansetron (ZOFRAN-ODT) 4 MG disintegrating tablet Take 4 mg by mouth every 8 (eight) hours as needed  for nausea or vomiting.   Yes [provider]    Physical Exam: Vitals:   02/28/20 1124 02/28/20 1133  BP: (!) 77/52   Pulse: 69   Resp: (!) 48   Temp: 98.1 F (36.7 C)   TempSrc: Oral   SpO2: 97% 96%  Weight: 104.3 kg   Height: 5\' 3"  (1.6 m)     Constitutional: NAD, AAOx3 HEENT: conjunctivae and lids normal, EOMI CV: RRR no M,R,G. Distal pulses +2.  No cyanosis.   RESP: reduced air movement, cough with expiration, on 5L GI: +BS, NTND Extremities: No  effusions, edema, or tenderness in BLE SKIN: warm, dry and intact Neuro: II - XII grossly intact.  Sensation intact Psych: Normal mood and affect.  Appropriate judgement and reason   Labs on Admission: I have personally reviewed following labs and imaging studies  CBC: Recent Labs  Lab 02/28/20 1143  WBC 5.3  NEUTROABS 3.8  HGB 14.7  HCT 42.7  MCV 88.4  PLT 262   Basic Metabolic Panel: Recent Labs  Lab 02/28/20 1143  NA 138  K 2.7*  CL 101  CO2 24  GLUCOSE 132*  BUN 8  CREATININE 0.72  CALCIUM 8.6*   GFR: Estimated Creatinine Clearance: 123.1 mL/min (by C-G formula based on SCr of 0.72 mg/dL). Liver Function Tests: Recent Labs  Lab 02/28/20 1143  AST 63*  ALT 24  ALKPHOS 53  BILITOT 0.7  PROT 7.7  ALBUMIN 3.5   No results for input(s): LIPASE, AMYLASE in the last 168 hours. No results for input(s): AMMONIA in the last 168 hours. Coagulation Profile: No results for input(s): INR, PROTIME in the last 168 hours. Cardiac Enzymes: No results for input(s): CKTOTAL, CKMB, CKMBINDEX, TROPONINI in the last 168 hours. BNP (last 3 results) No results for input(s): PROBNP in the last 8760 hours. HbA1C: No results for input(s): HGBA1C in the last 72 hours. CBG: No results for input(s): GLUCAP in the last 168 hours. Lipid Profile: Recent Labs    02/28/20 1330  TRIG 197*   Thyroid Function Tests: No results for input(s): TSH, T4TOTAL, FREET4, T3FREE, THYROIDAB in the last 72 hours. Anemia Panel: Recent Labs    02/28/20 1339  FERRITIN 423*   Urine analysis:    Component Value Date/Time   COLORURINE AMBER (A) 02/28/2020 1341   APPEARANCEUR CLOUDY (A) 02/28/2020 1341   APPEARANCEUR Cloudy 01/13/2014 1011   LABSPEC >1.046 (H) 02/28/2020 1341   LABSPEC 1.020 01/13/2014 1011   PHURINE 5.0 02/28/2020 1341   GLUCOSEU NEGATIVE 02/28/2020 1341   GLUCOSEU Negative 01/13/2014 1011   HGBUR LARGE (A) 02/28/2020 1341   BILIRUBINUR NEGATIVE 02/28/2020 1341    BILIRUBINUR Negative 01/13/2014 1011   KETONESUR NEGATIVE 02/28/2020 1341   PROTEINUR 100 (A) 02/28/2020 1341   UROBILINOGEN 1.0 04/24/2008 1145   NITRITE NEGATIVE 02/28/2020 1341   LEUKOCYTESUR TRACE (A) 02/28/2020 1341   LEUKOCYTESUR Negative 01/13/2014 1011    Radiological Exams on Admission: DG Chest Port 1 View  Result Date: 02/28/2020 CLINICAL DATA:  Shortness of breath EXAM: PORTABLE CHEST 1 VIEW COMPARISON:  February 19, 2020 FINDINGS: There is airspace opacity throughout the lungs bilaterally. Heart is upper normal in size with pulmonary vascularity normal. No adenopathy. No bone lesions. IMPRESSION: Extensive airspace opacity throughout the lungs bilaterally. Atypical organism pneumonia most likely etiology given this appearance. Heart upper normal in size. No adenopathy appreciable. Electronically Signed   By: February 21, 2020 III M.D.   On: 02/28/2020 13:16  Darlin Priestly MD [facility]  If 7PM-7AM, please contact night-coverage 02/28/2020, 4:11 PM

## 2020-02-28 NOTE — ED Triage Notes (Signed)
Pt comes into the ED via EMS from urgent care with c/o increased SOB , states she was dx with covid 2 weeks ago and having increased SOB over the past 4 days, sats 82%RA pt placed on 4-6L: [place] 95%. 120/81, 112HR, 36RR, 97.9temp

## 2020-02-28 NOTE — ED Triage Notes (Signed)
See first nurse note (+Covid with shortness of breath and weakness). Patient now on 6L O2, pale and diaphoretic.

## 2020-02-28 NOTE — ED Notes (Signed)
Patient is a difficult stick, this RN attempted twice, coworker will attempt if not will consult IV team

## 2020-02-29 DIAGNOSIS — E876 Hypokalemia: Secondary | ICD-10-CM

## 2020-02-29 DIAGNOSIS — J9601 Acute respiratory failure with hypoxia: Secondary | ICD-10-CM

## 2020-02-29 LAB — BASIC METABOLIC PANEL
Anion gap: 9 (ref 5–15)
BUN: 10 mg/dL (ref 6–20)
CO2: 25 mmol/L (ref 22–32)
Calcium: 8.3 mg/dL — ABNORMAL LOW (ref 8.9–10.3)
Chloride: 105 mmol/L (ref 98–111)
Creatinine, Ser: 0.47 mg/dL (ref 0.44–1.00)
GFR calc Af Amer: >60 mL/min (ref >60)
GFR calc non Af Amer: >60 mL/min (ref >60)
Glucose, Bld: 131 mg/dL — ABNORMAL HIGH (ref 70–99)
Potassium: 3.1 mmol/L — ABNORMAL LOW (ref 3.5–5.1)
Sodium: 139 mmol/L (ref 135–145)

## 2020-02-29 LAB — CBC
HCT: 36.1 % (ref 36.0–46.0)
Hemoglobin: 12.7 g/dL (ref 12.0–15.0)
MCH: 31.1 pg (ref 26.0–34.0)
MCHC: 35.2 g/dL (ref 30.0–36.0)
MCV: 88.3 fL (ref 80.0–100.0)
Platelets: 264 10*3/uL (ref 150–400)
RBC: 4.09 MIL/uL (ref 3.87–5.11)
RDW: 12.4 % (ref 11.5–15.5)
WBC: 4.2 10*3/uL (ref 4.0–10.5)
nRBC: 0 % (ref 0.0–0.2)

## 2020-02-29 LAB — PROCALCITONIN: Procalcitonin: <0.1 ng/mL

## 2020-02-29 LAB — MAGNESIUM: Magnesium: 2.1 mg/dL (ref 1.7–2.4)

## 2020-02-29 MED ORDER — TOCILIZUMAB 400 MG/20ML IV SOLN
800.0000 mg | Freq: Once | INTRAVENOUS | Status: AC
  Administered 2020-02-29: 800 mg via INTRAVENOUS
  Filled 2020-02-29: qty 40

## 2020-02-29 MED ORDER — IVERMECTIN 3 MG PO TABS
150.0000 ug/kg | ORAL_TABLET | Freq: Once | ORAL | Status: AC
  Administered 2020-02-29: 15000 ug via ORAL
  Filled 2020-02-29: qty 5

## 2020-02-29 MED ORDER — POTASSIUM CHLORIDE CRYS ER 20 MEQ PO TBCR
40.0000 meq | EXTENDED_RELEASE_TABLET | ORAL | Status: AC
  Administered 2020-02-29 (×2): 40 meq via ORAL
  Filled 2020-02-29 (×2): qty 2

## 2020-02-29 NOTE — Progress Notes (Signed)
Pt noted to have O2 saturations of 88-90% on 15L HFNC showing on monitor. Pt slouched down in bed, resting food on abdomen and eating with head at 45 degree angle. Patient again educated on proper positioning for lung expansion and to prevent aspiration.

## 2020-02-29 NOTE — Progress Notes (Signed)
Patient educated on methods to help reduce O2 requirements and encourage adequate oxygenation, including prone or semi prone positioning, getting up to chair, and using IS/flutter valve. Pt verbalized understanding and agreeable to implementing methods.

## 2020-02-29 NOTE — Progress Notes (Signed)
Education continued on implementing methods of improving oxygenation, as patient has required considerable prompting and reinforcement. Pt attempted semi prone position for 15 min only, thus far. Refusing to get OOB for meals.

## 2020-02-29 NOTE — Progress Notes (Signed)
PROGRESS NOTE    Alexis Horne  GGE:[id number] DOB: 02/06/1991 DOA: 02/28/2020 PCP: Patient, No Pcp Per   Brief Narrative: Taken from H&P. Alexis Horne is a 29 y.o. female with medical history significant of obesity who presented with dyspnea and cough after a COVID-19 dx. Apparently she was diagnosed more than a week ago, recently started worsening prompted her to come to ED.  Patient is unvaccinated. Patient was hypoxic and hypotensive on arrival.  Initially needing 6 L, deteriorated overnight and requiring 15L in the morning. Chest x-ray with extensive airspace opacities bilaterally.  Read on remdesivir and Decadron.  Given Actemra today.  Subjective: Patient continued to feel short of breath and coughing.  Some chest pain which seems pleuritic as it increased with coughing.  Discussed about Actemra due to worsening hypoxia and elevated inflammatory markers, patient initially took couple of hours to decide and then decided to proceed.  Assessment & Plan:   Active Problems:   Pneumonia due to COVID-19 virus  Acute hypoxic respiratory failure secondary to COVID-19 pneumonia. Patient with worsening oxygen requirement and elevated inflammatory markers.  CRP at 9.1.  Procalcitonin negative. -Try Actemra x1-discussed with patient and she took some time to decide but then eventually agreed to proceed. -Continue remdesivir and Decadron-day 2 -Give her 1 dose of ivermectin. -Continue supplemental oxygen to keep saturation above 90%. -Try proning as tolerated. -Continue to monitor inflammatory markers. -Continue supportive care.  Hypokalemia.  Improved but still low at 3.1 -Place potassium and monitor.  Hypotension.  He was hypotensive on initial presentation most likely secondary to volume depletion.  Blood pressure normalized with IV fluid. -Continue to monitor  Objective: Vitals:   02/28/20 2018 02/28/20 2300 02/29/20 0542 02/29/20 0740  BP: 129/85  (!) 139/98 (!) 146/87   Pulse: (!) 102  93 74  Resp:   17 (!) 22  Temp: 99.6 F (37.6 C)  98.3 F (36.8 C)   TempSrc: Oral     SpO2: (!) 89% 94% 96% 96%  Weight:      Height:       No intake or output data in the 24 hours ending 02/29/20 1443 Filed Weights   02/28/20 1124  Weight: 104.3 kg    Examination:  General exam: Appears calm and comfortable  Respiratory system: Clear to auscultation. Respiratory effort normal. Cardiovascular system: S1 & S2 heard, RRR. No JVD, murmurs, Gastrointestinal system: Soft, nontender, nondistended, bowel sounds positive. Central nervous system: Alert and oriented. No focal neurological deficits.Symmetric 5 x 5 power. Extremities: No edema, no cyanosis, pulses intact and symmetrical. Skin: No rashes, lesions or ulcers Psychiatry: Judgement and insight appear normal. Mood & affect appropriate.    DVT prophylaxis: Lovenox Code Status: Full Family Communication: Discussed with patient Disposition Plan:  Status is: Inpatient  Remains inpatient appropriate because:Inpatient level of care appropriate due to severity of illness   Dispo: The patient is from: Home              Anticipated d/c is to: Home              Anticipated d/c date is: 3 days              Patient currently is not medically stable to d/c.   Consultants:   None  Procedures:  Antimicrobials:   Data Reviewed: I have personally reviewed following labs and imaging studies  CBC: Recent Labs  Lab 02/28/20 1143 02/29/20 0526  WBC 5.3 4.2  NEUTROABS 3.8  --  HGB 14.7 12.7  HCT 42.7 36.1  MCV 88.4 88.3  PLT 262 264   Basic Metabolic Panel: Recent Labs  Lab 02/28/20 1143 02/29/20 0526  NA 138 139  K 2.7* 3.1*  CL 101 105  CO2 24 25  GLUCOSE 132* 131*  BUN 8 10  CREATININE 0.72 0.47  CALCIUM 8.6* 8.3*  MG  --  2.1   GFR: Estimated Creatinine Clearance: 123.1 mL/min (by C-G formula based on SCr of 0.47 mg/dL). Liver Function Tests: Recent Labs  Lab 02/28/20 1143  AST 63*   ALT 24  ALKPHOS 53  BILITOT 0.7  PROT 7.7  ALBUMIN 3.5   No results for input(s): LIPASE, AMYLASE in the last 168 hours. No results for input(s): AMMONIA in the last 168 hours. Coagulation Profile: No results for input(s): INR, PROTIME in the last 168 hours. Cardiac Enzymes: No results for input(s): CKTOTAL, CKMB, CKMBINDEX, TROPONINI in the last 168 hours. BNP (last 3 results) No results for input(s): PROBNP in the last 8760 hours. HbA1C: No results for input(s): HGBA1C in the last 72 hours. CBG: No results for input(s): GLUCAP in the last 168 hours. Lipid Profile: Recent Labs    02/28/20 1330  TRIG 197*   Thyroid Function Tests: No results for input(s): TSH, T4TOTAL, FREET4, T3FREE, THYROIDAB in the last 72 hours. Anemia Panel: Recent Labs    02/28/20 1339  FERRITIN 423*   Sepsis Labs: Recent Labs  Lab 02/28/20 1143 02/28/20 1339 02/29/20 0526  PROCALCITON  --  <0.10 <0.10  LATICACIDVEN 1.5 1.7  --     Recent Results (from the past 240 hour(s))  Blood Culture (routine x 2)     Status: None (Preliminary result)   Collection Time: 02/28/20  1:39 PM   Specimen: BLOOD  Result Value Ref Range Status   Specimen Description BLOOD RIGHT ANTECUBITAL  Final   Special Requests   Final    BOTTLES DRAWN AEROBIC AND ANAEROBIC Blood Culture adequate volume   Culture   Final    NO GROWTH < 24 HOURS Performed at [facility], [address], [place], [place] [address]    Report Status PENDING  Incomplete  Blood Culture (routine x 2)     Status: None (Preliminary result)   Collection Time: 02/28/20  1:39 PM   Specimen: BLOOD  Result Value Ref Range Status   Specimen Description BLOOD LEFT ANTECUBITAL  Final   Special Requests   Final    BOTTLES DRAWN AEROBIC AND ANAEROBIC Blood Culture results may not be optimal due to an inadequate volume of blood received in culture bottles   Culture   Final    NO GROWTH < 24 HOURS Performed at [facility], [address], [place], [place] [address]    Report Status PENDING  Incomplete  SARS Coronavirus 2 by RT PCR (hospital order, performed in [facility] hospital lab) Nasopharyngeal Nasopharyngeal Swab     Status: Abnormal   Collection Time: 02/28/20  5:21 PM   Specimen: Nasopharyngeal Swab  Result Value Ref Range Status   SARS Coronavirus 2 POSITIVE (A) NEGATIVE Final    Comment: RESULT CALLED TO, READ BACK BY AND VERIFIED WITH: JADEKA MANGRUM @1844  ON 02/28/20 SKL (NOTE) SARS-CoV-2 target nucleic acids are DETECTED  SARS-CoV-2 RNA is generally detectable in upper respiratory specimens  during the acute phase of infection.  Positive results are indicative  of the presence of the identified virus, but do not rule out bacterial infection or co-infection with  other pathogens not detected by the test.  Clinical correlation with patient history and  other diagnostic information is necessary to determine patient infection status.  The expected result is negative.  Fact Sheet for Patients:   BoilerBrush.com.cy   Fact Sheet for Healthcare Providers:   https://pope.com/    This test is not yet approved or cleared by the [place] FDA and  has been authorized for detection and/or diagnosis of SARS-CoV-2 by FDA under an Emergency Use Authorization (EUA).  This EUA will remain in effect (meaning this t est can be used) for the duration of  the COVID-19 declaration under Section 564(b)(1) of the Act, 21 U.S.C. section 360-bbb-3(b)(1), unless the authorization is terminated or revoked sooner.  Performed at [facility], [address] [place], [place] [address]      Radiology Studies: CT Angio Chest PE W and/or Wo Contrast  Result Date: 02/28/2020 CLINICAL DATA:  Shortness of breath.  Recent diagnosis of COVID-19 EXAM: CT ANGIOGRAPHY CHEST WITH CONTRAST TECHNIQUE: Multidetector CT imaging of the chest was performed using the  standard protocol during bolus administration of intravenous contrast. Multiplanar CT image reconstructions and MIPs were obtained to evaluate the vascular anatomy. CONTRAST:  55mL OMNIPAQUE IOHEXOL 350 MG/ML SOLN COMPARISON:  CT 03/19/2018, chest x-ray 02/28/2020 FINDINGS: Cardiovascular: Satisfactory opacification of the pulmonary arteries although evaluation of the segmental and subsegmental branches is degraded by extensive respiratory motion artifact. No evidence of pulmonary embolism to the lobar branch level. Normal heart size. No pericardial effusion. Thoracic aorta is normal in course and caliber. Mediastinum/Nodes: No enlarged mediastinal, hilar, or axillary lymph nodes. Thyroid gland, trachea, and esophagus demonstrate no significant findings. Lungs/Pleura: Extensive patchy airspace consolidations throughout both lungs. No pleural effusion or pneumothorax. Upper Abdomen: Diffuse hepatic steatosis. Musculoskeletal: No chest wall abnormality. No acute or significant osseous findings. Review of the MIP images confirms the above findings. IMPRESSION: 1. No evidence of pulmonary embolism to the lobar branch level. 2. Extensive patchy airspace consolidations throughout both lungs, consistent with multifocal pneumonia in the setting of COVID-19 infection. 3. Diffuse hepatic steatosis. Electronically Signed   By: Duanne Guess D.O.   On: 02/28/2020 17:01   DG Chest Port 1 View  Result Date: 02/28/2020 CLINICAL DATA:  Shortness of breath EXAM: PORTABLE CHEST 1 VIEW COMPARISON:  February 19, 2020 FINDINGS: There is airspace opacity throughout the lungs bilaterally. Heart is upper normal in size with pulmonary vascularity normal. No adenopathy. No bone lesions. IMPRESSION: Extensive airspace opacity throughout the lungs bilaterally. Atypical organism pneumonia most likely etiology given this appearance. Heart upper normal in size. No adenopathy appreciable. Electronically Signed   By: Bretta Bang III M.D.    On: 02/28/2020 13:16    Scheduled Meds: . vitamin C  500 mg Oral Daily  . dexamethasone  6 mg Oral Daily  . enoxaparin (LOVENOX) injection  50 mg Subcutaneous Q24H  . Ipratropium-Albuterol  1 puff Inhalation Q6H  . potassium chloride  40 mEq Oral Q4H  . zinc sulfate  220 mg Oral Daily   Continuous Infusions: . remdesivir 100 mg in NS 100 mL 100 mg (02/29/20 1042)  . tocilizumab (ACTEMRA) - non-COVID treatment       LOS: 1 day   Time spent: 45 minutes.  Arnetha Courser, MD [facility]  If 7PM-7AM, please contact night-coverage [email or web address]  02/29/2020, 2:43 PM   This record has been created using Conservation officer, historic buildings. Errors have been sought and corrected,but may not always be located. Such  creation errors do not reflect on the standard of care.

## 2020-02-29 NOTE — Progress Notes (Signed)
First dose of Actemra initiated. Pt again re-educated on importance of prone and semi prone positioning, as well as use of IS/flutter valve. Pt noncompliant with all of the above.

## 2020-03-01 LAB — COMPREHENSIVE METABOLIC PANEL
ALT: 17 U/L (ref 0–44)
AST: 35 U/L (ref 15–41)
Albumin: 2.7 g/dL — ABNORMAL LOW (ref 3.5–5.0)
Alkaline Phosphatase: 39 U/L (ref 38–126)
Anion gap: 8 (ref 5–15)
BUN: 13 mg/dL (ref 6–20)
CO2: 25 mmol/L (ref 22–32)
Calcium: 8.5 mg/dL — ABNORMAL LOW (ref 8.9–10.3)
Chloride: 108 mmol/L (ref 98–111)
Creatinine, Ser: 0.51 mg/dL (ref 0.44–1.00)
GFR calc Af Amer: >60 mL/min (ref >60)
GFR calc non Af Amer: >60 mL/min (ref >60)
Glucose, Bld: 108 mg/dL — ABNORMAL HIGH (ref 70–99)
Potassium: 3.8 mmol/L (ref 3.5–5.1)
Sodium: 141 mmol/L (ref 135–145)
Total Bilirubin: 0.6 mg/dL (ref 0.3–1.2)
Total Protein: 6.1 g/dL — ABNORMAL LOW (ref 6.5–8.1)

## 2020-03-01 LAB — CBC
HCT: 35.1 % — ABNORMAL LOW (ref 36.0–46.0)
Hemoglobin: 12.1 g/dL (ref 12.0–15.0)
MCH: 30.6 pg (ref 26.0–34.0)
MCHC: 34.5 g/dL (ref 30.0–36.0)
MCV: 88.9 fL (ref 80.0–100.0)
Platelets: 318 10*3/uL (ref 150–400)
RBC: 3.95 MIL/uL (ref 3.87–5.11)
RDW: 12.4 % (ref 11.5–15.5)
WBC: 5.3 10*3/uL (ref 4.0–10.5)
nRBC: 0 % (ref 0.0–0.2)

## 2020-03-01 LAB — PROCALCITONIN: Procalcitonin: <0.1 ng/mL

## 2020-03-01 LAB — PHOSPHORUS: Phosphorus: 3.2 mg/dL (ref 2.5–4.6)

## 2020-03-01 LAB — MAGNESIUM: Magnesium: 2 mg/dL (ref 1.7–2.4)

## 2020-03-01 LAB — C-REACTIVE PROTEIN: CRP: 1.7 mg/dL — ABNORMAL HIGH (ref <1.0)

## 2020-03-01 LAB — FIBRIN DERIVATIVES D-DIMER (ARMC ONLY): Fibrin derivatives D-dimer (ARMC): 449.46 ng/mL (FEU) (ref 0.00–499.00)

## 2020-03-01 NOTE — Plan of Care (Signed)

## 2020-03-01 NOTE — Progress Notes (Signed)
PROGRESS NOTE    Alexis Horne  AOZ:[id number] DOB: Mar 06, 1991 DOA: 02/28/2020 PCP: Patient, No Pcp Per   Brief Narrative: Taken from H&P. Alexis Horne is a 29 y.o. female with medical history significant of obesity who presented with dyspnea and cough after a COVID-19 dx. Apparently she was diagnosed more than a week ago, recently started worsening prompted her to come to ED.  Patient is unvaccinated. Patient was hypoxic and hypotensive on arrival.  Initially needing 6 L, deteriorated overnight and requiring 15L in the morning. Chest x-ray with extensive airspace opacities bilaterally.  Read on remdesivir and Decadron.  Given Actemra today.  Subjective: Patient was feeling little better when seen today.  She was saturating in high 90s on HF [place], at 40 L and 80% of FiO2  Assessment & Plan:   Active Problems:   COVID-19   Acute respiratory failure with hypoxia (HCC)   Hypokalemia  Acute hypoxic respiratory failure secondary to COVID-19 pneumonia. Patient with worsening oxygen requirement and elevated inflammatory markers.  CRP at 9.1.  Procalcitonin negative.  Received 1 dose of Actemra with significant reduction in CRP today.  She also received 1 dose of ivermectin -Continue remdesivir and Decadron-day 3 -Continue supplemental oxygen to keep saturation above 90%-currently saturating in high 90s-asked nursing staff to try weaning her down. -Try proning as tolerated. -Continue to monitor inflammatory markers. -Continue supportive care.  Hypokalemia.  Resolved -Continue to monitor-replete as needed.  Hypotension.  He was hypotensive on initial presentation most likely secondary to volume depletion.  Blood pressure normalized with IV fluid. -Continue to monitor  Objective: Vitals:   03/01/20 0855 03/01/20 1216 03/01/20 1547 03/01/20 1620  BP:  134/85 125/74   Pulse:  72 67   Resp:  19 19   Temp:  97.9 F (36.6 C) 98.1 F (36.7 C)   TempSrc:  Oral Oral   SpO2: 94%  95% 99% 96%  Weight:      Height:        Intake/Output Summary (Last 24 hours) at 03/01/2020 1734 Last data filed at 03/01/2020 0045 Gross per 24 hour  Intake 480 ml  Output --  Net 480 ml   Filed Weights   02/28/20 1124 02/29/20 2346 03/01/20 0533  Weight: 104.3 kg 115.1 kg 115.5 kg    Examination:  General.  Well-developed, obese young lady, in no acute distress. Pulmonary.  Lungs clear bilaterally, normal respiratory effort. CV.  Regular rate and rhythm, no JVD, rub or murmur. Abdomen.  Soft, nontender, nondistended, BS positive. CNS.  Alert and oriented x3.  No focal neurologic deficit. Extremities.  No edema, no cyanosis, pulses intact and symmetrical. Psychiatry.  Judgment and insight appears normal.  DVT prophylaxis: Lovenox Code Status: Full Family Communication: Discussed with patient Disposition Plan:  Status is: Inpatient  Remains inpatient appropriate because:Inpatient level of care appropriate due to severity of illness   Dispo: The patient is from: Home              Anticipated d/c is to: Home              Anticipated d/c date is: 2 days.              Patient currently is not medically stable to d/c.   Consultants:   None  Procedures:  Antimicrobials:   Data Reviewed: I have personally reviewed following labs and imaging studies  CBC: Recent Labs  Lab 02/28/20 1143 02/29/20 0526 03/01/20 0509  WBC 5.3 4.2 5.3  NEUTROABS 3.8  --   --   HGB 14.7 12.7 12.1  HCT 42.7 36.1 35.1*  MCV 88.4 88.3 88.9  PLT 262 264 318   Basic Metabolic Panel: Recent Labs  Lab 02/28/20 1143 02/29/20 0526 03/01/20 0509  NA 138 139 141  K 2.7* 3.1* 3.8  CL 101 105 108  CO2 24 25 25   GLUCOSE 132* 131* 108*  BUN 8 10 13   CREATININE 0.72 0.47 0.51  CALCIUM 8.6* 8.3* 8.5*  MG  --  2.1 2.0  PHOS  --   --  3.2   GFR: Estimated Creatinine Clearance: 130.5 mL/min (by C-G formula based on SCr of 0.51 mg/dL). Liver Function Tests: Recent Labs  Lab  02/28/20 1143 03/01/20 0509  AST 63* 35  ALT 24 17  ALKPHOS 53 39  BILITOT 0.7 0.6  PROT 7.7 6.1*  ALBUMIN 3.5 2.7*   No results for input(s): LIPASE, AMYLASE in the last 168 hours. No results for input(s): AMMONIA in the last 168 hours. Coagulation Profile: No results for input(s): INR, PROTIME in the last 168 hours. Cardiac Enzymes: No results for input(s): CKTOTAL, CKMB, CKMBINDEX, TROPONINI in the last 168 hours. BNP (last 3 results) No results for input(s): PROBNP in the last 8760 hours. HbA1C: No results for input(s): HGBA1C in the last 72 hours. CBG: No results for input(s): GLUCAP in the last 168 hours. Lipid Profile: Recent Labs    02/28/20 1330  TRIG 197*   Thyroid Function Tests: No results for input(s): TSH, T4TOTAL, FREET4, T3FREE, THYROIDAB in the last 72 hours. Anemia Panel: Recent Labs    02/28/20 1339  FERRITIN 423*   Sepsis Labs: Recent Labs  Lab 02/28/20 1143 02/28/20 1339 02/29/20 0526 03/01/20 0509  PROCALCITON  --  <0.10 <0.10 <0.10  LATICACIDVEN 1.5 1.7  --   --     Recent Results (from the past 240 hour(s))  Blood Culture (routine x 2)     Status: None (Preliminary result)   Collection Time: 02/28/20  1:39 PM   Specimen: BLOOD  Result Value Ref Range Status   Specimen Description BLOOD RIGHT ANTECUBITAL  Final   Special Requests   Final    BOTTLES DRAWN AEROBIC AND ANAEROBIC Blood Culture adequate volume   Culture   Final    NO GROWTH 2 DAYS Performed at [facility], [address], [place], [address] [place]    Report Status PENDING  Incomplete  Blood Culture (routine x 2)     Status: None (Preliminary result)   Collection Time: 02/28/20  1:39 PM   Specimen: BLOOD  Result Value Ref Range Status   Specimen Description BLOOD LEFT ANTECUBITAL  Final   Special Requests   Final    BOTTLES DRAWN AEROBIC AND ANAEROBIC Blood Culture results may not be optimal due to an inadequate volume of blood received in culture bottles    Culture   Final    NO GROWTH 2 DAYS Performed at [facility], [address] [place], [address] [place]    Report Status PENDING  Incomplete  Urine Culture     Status: Abnormal (Preliminary result)   Collection Time: 02/28/20  1:41 PM   Specimen: Urine, Random  Result Value Ref Range Status   Specimen Description   Final    URINE, RANDOM Performed at [facility], [address], [place], [address] [place]    Special Requests   Final    NONE Performed at [facility], [address] [place], [place] [address]    Culture (A)  Final    >=100,000 COLONIES/mL ESCHERICHIA COLI SUSCEPTIBILITIES TO FOLLOW Performed at [facility], [address] [place], [place] [address]    Report Status PENDING  Incomplete  SARS Coronavirus 2 by RT PCR (hospital order, performed in [facility] hospital lab) Nasopharyngeal Nasopharyngeal Swab     Status: Abnormal   Collection Time: 02/28/20  5:21 PM   Specimen: Nasopharyngeal Swab  Result Value Ref Range Status   SARS Coronavirus 2 POSITIVE (A) NEGATIVE Final    Comment: RESULT CALLED TO, READ BACK BY AND VERIFIED WITH: JADEKA MANGRUM @1844  ON 02/28/20 SKL (NOTE) SARS-CoV-2 target nucleic acids are DETECTED  SARS-CoV-2 RNA is generally detectable in upper respiratory specimens  during the acute phase of infection.  Positive results are indicative  of the presence of the identified virus, but do not rule out bacterial infection or co-infection with other pathogens not detected by the test.  Clinical correlation with patient history and  other diagnostic information is necessary to determine patient infection status.  The expected result is negative.  Fact Sheet for Patients:   04/29/20   Fact Sheet for Healthcare Providers:   BoilerBrush.com.cy    This test is not yet approved or cleared by the https://pope.com/ FDA and  has been authorized for  detection and/or diagnosis of SARS-CoV-2 by FDA under an Emergency Use Authorization (EUA).  This EUA will remain in effect (meaning this t est can be used) for the duration of  the COVID-19 declaration under Section 564(b)(1) of the Act, 21 U.S.C. section 360-bbb-3(b)(1), unless the authorization is terminated or revoked sooner.  Performed at [facility], [address], [place], [place]      Radiology Studies: No results found.  Scheduled Meds: . vitamin C  500 mg Oral Daily  . dexamethasone  6 mg Oral Daily  . enoxaparin (LOVENOX) injection  50 mg Subcutaneous Q24H  . Ipratropium-Albuterol  1 puff Inhalation Q6H  . zinc sulfate  220 mg Oral Daily   Continuous Infusions: . remdesivir 100 mg in NS 100 mL 100 mg (03/01/20 0828)     LOS: 2 days   Time spent: 35 minutes.  05/01/20, MD [facility]  If 7PM-7AM, please contact night-coverage [email or web address]  03/01/2020, 5:34 PM   This record has been created using 05/01/2020. Errors have been sought and corrected,but may not always be located. Such creation errors do not reflect on the standard of care.

## 2020-03-02 LAB — URINE CULTURE: Culture: >=100000 — AB

## 2020-03-02 LAB — COMPREHENSIVE METABOLIC PANEL
ALT: 17 U/L (ref 0–44)
AST: 33 U/L (ref 15–41)
Albumin: 2.9 g/dL — ABNORMAL LOW (ref 3.5–5.0)
Alkaline Phosphatase: 38 U/L (ref 38–126)
Anion gap: 8 (ref 5–15)
BUN: 13 mg/dL (ref 6–20)
CO2: 26 mmol/L (ref 22–32)
Calcium: 8.3 mg/dL — ABNORMAL LOW (ref 8.9–10.3)
Chloride: 105 mmol/L (ref 98–111)
Creatinine, Ser: 0.47 mg/dL (ref 0.44–1.00)
GFR calc Af Amer: >60 mL/min (ref >60)
GFR calc non Af Amer: >60 mL/min (ref >60)
Glucose, Bld: 118 mg/dL — ABNORMAL HIGH (ref 70–99)
Potassium: 3.7 mmol/L (ref 3.5–5.1)
Sodium: 139 mmol/L (ref 135–145)
Total Bilirubin: 0.6 mg/dL (ref 0.3–1.2)
Total Protein: 6 g/dL — ABNORMAL LOW (ref 6.5–8.1)

## 2020-03-02 LAB — CBC
HCT: 35.1 % — ABNORMAL LOW (ref 36.0–46.0)
Hemoglobin: 12.1 g/dL (ref 12.0–15.0)
MCH: 30.8 pg (ref 26.0–34.0)
MCHC: 34.5 g/dL (ref 30.0–36.0)
MCV: 89.3 fL (ref 80.0–100.0)
Platelets: 325 10*3/uL (ref 150–400)
RBC: 3.93 MIL/uL (ref 3.87–5.11)
RDW: 12 % (ref 11.5–15.5)
WBC: 5.7 10*3/uL (ref 4.0–10.5)
nRBC: 0 % (ref 0.0–0.2)

## 2020-03-02 LAB — PHOSPHORUS: Phosphorus: 3.8 mg/dL (ref 2.5–4.6)

## 2020-03-02 LAB — MAGNESIUM: Magnesium: 1.9 mg/dL (ref 1.7–2.4)

## 2020-03-02 LAB — PROCALCITONIN: Procalcitonin: <0.1 ng/mL

## 2020-03-02 LAB — C-REACTIVE PROTEIN: CRP: 0.9 mg/dL (ref <1.0)

## 2020-03-02 LAB — FIBRIN DERIVATIVES D-DIMER (ARMC ONLY): Fibrin derivatives D-dimer (ARMC): 320.78 ng/mL (FEU) (ref 0.00–499.00)

## 2020-03-02 NOTE — Progress Notes (Signed)
PROGRESS NOTE    Alexis Horne  FOY:[id number] DOB: 02/20/1991 DOA: 02/28/2020 PCP: Patient, No Pcp Per   Brief Narrative: Taken from H&P. Alexis Horne is a 29 y.o. female with medical history significant of obesity who presented with dyspnea and cough after a COVID-19 dx. Apparently she was diagnosed more than a week ago, recently started worsening prompted her to come to ED.  Patient is unvaccinated. Patient was hypoxic and hypotensive on arrival.  Initially needing 6 L, deteriorated overnight and requiring 15L in the morning. Chest x-ray with extensive airspace opacities bilaterally.  Read on remdesivir and Decadron.  Given Actemra today.  Subjective: Patient was feeling better when seen today.  She was able to wean up to 7L.  Assessment & Plan:   Active Problems:   Pneumonia due to COVID-19 virus   Acute respiratory failure with hypoxia (HCC)   Hypokalemia  Acute hypoxic respiratory failure secondary to COVID-19 pneumonia. Patient with worsening oxygen requirement and elevated inflammatory markers.  CRP at 9.1>> 1.7.  Procalcitonin negative.  Received 1 dose of Actemra with significant reduction in CRP today.  She also received 1 dose of ivermectin -Continue remdesivir and Decadron-day 4 -Continue supplemental oxygen to keep saturation above 90%-currently saturating in high 90s-asked nursing staff to try weaning her down. -Try proning as tolerated. -Continue to monitor inflammatory markers. -Continue supportive care.  Hypokalemia.  Resolved -Continue to monitor-replete as needed.  Hypotension.  He was hypotensive on initial presentation most likely secondary to volume depletion.  Blood pressure normalized with IV fluid. -Continue to monitor  Objective: Vitals:   03/02/20 1153 03/02/20 1228 03/02/20 1421 03/02/20 1540  BP: 119/69   127/85  Pulse: 87   79  Resp: 20   17  Temp: 98.4 F (36.9 C)   98.8 F (37.1 C)  TempSrc: Oral   Oral  SpO2: 94% 93% 95% 100%   Weight:      Height:        Intake/Output Summary (Last 24 hours) at 03/02/2020 1602 Last data filed at 03/02/2020 0251 Gross per 24 hour  Intake 720 ml  Output --  Net 720 ml   Filed Weights   02/29/20 2346 03/01/20 0533 03/02/20 0438  Weight: 115.1 kg 115.5 kg 115 kg    Examination:  General.  Well-developed, obese lady, in no acute distress. Pulmonary.  Lungs clear bilaterally, normal respiratory effort. CV.  Regular rate and rhythm, no JVD, rub or murmur. Abdomen.  Soft, nontender, nondistended, BS positive. CNS.  Alert and oriented x3.  No focal neurologic deficit. Extremities.  No edema, no cyanosis, pulses intact and symmetrical. Psychiatry.  Judgment and insight appears normal.  DVT prophylaxis: Lovenox Code Status: Full Family Communication: Discussed with patient Disposition Plan:  Status is: Inpatient  Remains inpatient appropriate because:Inpatient level of care appropriate due to severity of illness   Dispo: The patient is from: Home              Anticipated d/c is to: Home              Anticipated d/c date is: 1 days.              Patient currently is not medically stable to d/c.   Consultants:   None  Procedures:  Antimicrobials:   Data Reviewed: I have personally reviewed following labs and imaging studies  CBC: Recent Labs  Lab 02/28/20 1143 02/29/20 0526 03/01/20 0509 03/02/20 0552  WBC 5.3 4.2 5.3 5.7  NEUTROABS  3.8  --   --   --   HGB 14.7 12.7 12.1 12.1  HCT 42.7 36.1 35.1* 35.1*  MCV 88.4 88.3 88.9 89.3  PLT 262 264 318 325   Basic Metabolic Panel: Recent Labs  Lab 02/28/20 1143 02/29/20 0526 03/01/20 0509 03/02/20 0552  NA 138 139 141 139  K 2.7* 3.1* 3.8 3.7  CL 101 105 108 105  CO2 24 25 25 26   GLUCOSE 132* 131* 108* 118*  BUN 8 10 13 13   CREATININE 0.72 0.47 0.51 0.47  CALCIUM 8.6* 8.3* 8.5* 8.3*  MG  --  2.1 2.0 1.9  PHOS  --   --  3.2 3.8   GFR: Estimated Creatinine Clearance: 130.2 mL/min (by C-G formula  based on SCr of 0.47 mg/dL). Liver Function Tests: Recent Labs  Lab 02/28/20 1143 03/01/20 0509 03/02/20 0552  AST 63* 35 33  ALT 24 17 17   ALKPHOS 53 39 38  BILITOT 0.7 0.6 0.6  PROT 7.7 6.1* 6.0*  ALBUMIN 3.5 2.7* 2.9*   No results for input(s): LIPASE, AMYLASE in the last 168 hours. No results for input(s): AMMONIA in the last 168 hours. Coagulation Profile: No results for input(s): INR, PROTIME in the last 168 hours. Cardiac Enzymes: No results for input(s): CKTOTAL, CKMB, CKMBINDEX, TROPONINI in the last 168 hours. BNP (last 3 results) No results for input(s): PROBNP in the last 8760 hours. HbA1C: No results for input(s): HGBA1C in the last 72 hours. CBG: No results for input(s): GLUCAP in the last 168 hours. Lipid Profile: No results for input(s): CHOL, HDL, LDLCALC, TRIG, CHOLHDL, LDLDIRECT in the last 72 hours. Thyroid Function Tests: No results for input(s): TSH, T4TOTAL, FREET4, T3FREE, THYROIDAB in the last 72 hours. Anemia Panel: No results for input(s): VITAMINB12, FOLATE, FERRITIN, TIBC, IRON, RETICCTPCT in the last 72 hours. Sepsis Labs: Recent Labs  Lab 02/28/20 1143 02/28/20 1339 02/29/20 0526 03/01/20 0509 03/02/20 0552  PROCALCITON  --  <0.10 <0.10 <0.10 <0.10  LATICACIDVEN 1.5 1.7  --   --   --     Recent Results (from the past 240 hour(s))  Blood Culture (routine x 2)     Status: None (Preliminary result)   Collection Time: 02/28/20  1:39 PM   Specimen: BLOOD  Result Value Ref Range Status   Specimen Description BLOOD RIGHT ANTECUBITAL  Final   Special Requests   Final    BOTTLES DRAWN AEROBIC AND ANAEROBIC Blood Culture adequate volume   Culture   Final    NO GROWTH 3 DAYS Performed at [facility] [address] [place], [facility] [address]    Report Status PENDING  Incomplete  Blood Culture (routine x 2)     Status: None (Preliminary result)   Collection Time: 02/28/20  1:39 PM   Specimen: BLOOD  Result Value Ref Range  Status   Specimen Description BLOOD LEFT ANTECUBITAL  Final   Special Requests   Final    BOTTLES DRAWN AEROBIC AND ANAEROBIC Blood Culture results may not be optimal due to an inadequate volume of blood received in culture bottles   Culture   Final    NO GROWTH 3 DAYS Performed at [facility], [address] [place], [facility] [address]    Report Status PENDING  Incomplete  Urine Culture     Status: Abnormal   Collection Time: 02/28/20  1:41 PM   Specimen: Urine, Random  Result Value Ref Range Status   Specimen Description   Final  URINE, RANDOM Performed at [facility], [address] [place], [place] [address]    Special Requests   Final    NONE Performed at [facility], [address] [place], [place] [address]    Culture >=100,000 COLONIES/mL ESCHERICHIA COLI (A)  Final   Report Status 03/02/2020 FINAL  Final   Organism ID, Bacteria ESCHERICHIA COLI (A)  Final      Susceptibility   Escherichia coli - MIC*    AMPICILLIN <=2 SENSITIVE Sensitive     CEFAZOLIN <=4 SENSITIVE Sensitive     CEFTRIAXONE <=0.25 SENSITIVE Sensitive     CIPROFLOXACIN <=0.25 SENSITIVE Sensitive     GENTAMICIN <=1 SENSITIVE Sensitive     IMIPENEM <=0.25 SENSITIVE Sensitive     NITROFURANTOIN <=16 SENSITIVE Sensitive     TRIMETH/SULFA <=20 SENSITIVE Sensitive     AMPICILLIN/SULBACTAM <=2 SENSITIVE Sensitive     PIP/TAZO <=4 SENSITIVE Sensitive     * >=100,000 COLONIES/mL ESCHERICHIA COLI  SARS Coronavirus 2 by RT PCR (hospital order, performed in [facility] hospital lab) Nasopharyngeal Nasopharyngeal Swab     Status: Abnormal   Collection Time: 02/28/20  5:21 PM   Specimen: Nasopharyngeal Swab  Result Value Ref Range Status   SARS Coronavirus 2 POSITIVE (A) NEGATIVE Final    Comment: RESULT CALLED TO, READ BACK BY AND VERIFIED WITH: JADEKA MANGRUM @1844  ON 02/28/20 SKL (NOTE) SARS-CoV-2 target nucleic acids are DETECTED  SARS-CoV-2 RNA is generally detectable  in upper respiratory specimens  during the acute phase of infection.  Positive results are indicative  of the presence of the identified virus, but do not rule out bacterial infection or co-infection with other pathogens not detected by the test.  Clinical correlation with patient history and  other diagnostic information is necessary to determine patient infection status.  The expected result is negative.  Fact Sheet for Patients:   04/29/20   Fact Sheet for Healthcare Providers:   BoilerBrush.com.cy    This test is not yet approved or cleared by the https://pope.com/ FDA and  has been authorized for detection and/or diagnosis of SARS-CoV-2 by FDA under an Emergency Use Authorization (EUA).  This EUA will remain in effect (meaning this t est can be used) for the duration of  the COVID-19 declaration under Section 564(b)(1) of the Act, 21 U.S.C. section 360-bbb-3(b)(1), unless the authorization is terminated or revoked sooner.  Performed at [facility], [address], [place], [place]      Radiology Studies: No results found.  Scheduled Meds:  vitamin C  500 mg Oral Daily   dexamethasone  6 mg Oral Daily   enoxaparin (LOVENOX) injection  50 mg Subcutaneous Q24H   Ipratropium-Albuterol  1 puff Inhalation Q6H   zinc sulfate  220 mg Oral Daily   Continuous Infusions:  remdesivir 100 mg in NS 100 mL 100 mg (03/02/20 0917)     LOS: 3 days   Time spent: 35 minutes.  03/04/20, MD [facility]  If 7PM-7AM, please contact night-coverage [email or web address]  03/02/2020, 4:02 PM   This record has been created using 03/04/2020. Errors have been sought and corrected,but may not always be located. Such creation errors do not reflect on the standard of care.

## 2020-03-03 LAB — PHOSPHORUS: Phosphorus: 4 mg/dL (ref 2.5–4.6)

## 2020-03-03 LAB — COMPREHENSIVE METABOLIC PANEL
ALT: 27 U/L (ref 0–44)
AST: 54 U/L — ABNORMAL HIGH (ref 15–41)
Albumin: 3.3 g/dL — ABNORMAL LOW (ref 3.5–5.0)
Alkaline Phosphatase: 42 U/L (ref 38–126)
Anion gap: 8 (ref 5–15)
BUN: 12 mg/dL (ref 6–20)
CO2: 28 mmol/L (ref 22–32)
Calcium: 8.7 mg/dL — ABNORMAL LOW (ref 8.9–10.3)
Chloride: 102 mmol/L (ref 98–111)
Creatinine, Ser: 0.59 mg/dL (ref 0.44–1.00)
GFR calc Af Amer: >60 mL/min (ref >60)
GFR calc non Af Amer: >60 mL/min (ref >60)
Glucose, Bld: 116 mg/dL — ABNORMAL HIGH (ref 70–99)
Potassium: 3.9 mmol/L (ref 3.5–5.1)
Sodium: 138 mmol/L (ref 135–145)
Total Bilirubin: 0.7 mg/dL (ref 0.3–1.2)
Total Protein: 6.7 g/dL (ref 6.5–8.1)

## 2020-03-03 LAB — FIBRIN DERIVATIVES D-DIMER (ARMC ONLY): Fibrin derivatives D-dimer (ARMC): 316.02 ng/mL (FEU) (ref 0.00–499.00)

## 2020-03-03 LAB — C-REACTIVE PROTEIN: CRP: 0.6 mg/dL (ref <1.0)

## 2020-03-03 NOTE — Progress Notes (Signed)
PROGRESS NOTE    Alexis Horne  DGU:[id number] DOB: 06-30-1991 DOA: 02/28/2020 PCP: Patient, No Pcp Per   Brief Narrative: Taken from H&P. Alexis Horne is a 29 y.o. female with medical history significant of obesity who presented with dyspnea and cough after a COVID-19 dx. Apparently she was diagnosed more than a week ago, recently started worsening prompted her to come to ED.  Patient is unvaccinated. Patient was hypoxic and hypotensive on arrival.  Initially needing 6 L, deteriorated overnight and requiring 15L in the morning. Chest x-ray with extensive airspace opacities bilaterally.  Read on remdesivir and Decadron.  Given Actemra today.  Subjective: Patient was feeling little better when seen today.  Saturating well on 4 L, I decrease it to 2.  Assessment & Plan:   Active Problems:   Pneumonia due to COVID-19 virus   Acute respiratory failure with hypoxia (HCC)   Hypokalemia  Acute hypoxic respiratory failure secondary to COVID-19 pneumonia. Patient with worsening oxygen requirement and elevated inflammatory markers.  CRP at 9.1>> 1.7.  Procalcitonin negative.  Received 1 dose of Actemra with significant reduction in CRP today.  She also received 1 dose of ivermectin -Continue remdesivir and Decadron-day 5. -Continue to monitor inflammatory markers. -Continue supportive care. -Try weaning her off from oxygen. -Can be moved to MedSurg.  Hypokalemia.  Resolved -Continue to monitor-replete as needed.  Hypotension.  He was hypotensive on initial presentation most likely secondary to volume depletion.  Blood pressure normalized with IV fluid. -Continue to monitor  Objective: Vitals:   03/03/20 0512 03/03/20 0822 03/03/20 1011 03/03/20 1119  BP: 124/64 (!) 130/93  128/76  Pulse: 65 64  76  Resp: 15 19  19   Temp: 98.2 F (36.8 C) 98.4 F (36.9 C)  98.6 F (37 C)  TempSrc: Oral Oral  Oral  SpO2: 100% 98% 95%   Weight: 114.4 kg     Height:         Intake/Output Summary (Last 24 hours) at 03/03/2020 1448 Last data filed at 03/03/2020 0516 Gross per 24 hour  Intake --  Output 300 ml  Net -300 ml   Filed Weights   03/01/20 0533 03/02/20 0438 03/03/20 0512  Weight: 115.5 kg 115 kg 114.4 kg    Examination:  General.  Well-developed, obese lady, in no acute distress. Pulmonary.  Lungs clear bilaterally, normal respiratory effort. CV.  Regular rate and rhythm, no JVD, rub or murmur. Abdomen.  Soft, nontender, nondistended, BS positive. CNS.  Alert and oriented x3.  No focal neurologic deficit. Extremities.  No edema, no cyanosis, pulses intact and symmetrical. Psychiatry.  Judgment and insight appears normal.  DVT prophylaxis: Lovenox Code Status: Full Family Communication: Discussed with patient Disposition Plan:  Status is: Inpatient  Remains inpatient appropriate because:Inpatient level of care appropriate due to severity of illness   Dispo: The patient is from: Home              Anticipated d/c is to: Home              Anticipated d/c date is: 1 days.              Patient currently is not medically stable to d/c.  Patient shows significant improvement in oxygen requirement.  We will try to wean her off today and hopefully home tomorrow.   Consultants:   None  Procedures:  Antimicrobials:   Data Reviewed: I have personally reviewed following labs and imaging studies  CBC: Recent Labs  Lab 02/28/20 1143 02/29/20 0526 03/01/20 0509 03/02/20 0552  WBC 5.3 4.2 5.3 5.7  NEUTROABS 3.8  --   --   --   HGB 14.7 12.7 12.1 12.1  HCT 42.7 36.1 35.1* 35.1*  MCV 88.4 88.3 88.9 89.3  PLT 262 264 318 325   Basic Metabolic Panel: Recent Labs  Lab 02/28/20 1143 02/29/20 0526 03/01/20 0509 03/02/20 0552 03/03/20 0521  NA 138 139 141 139 138  K 2.7* 3.1* 3.8 3.7 3.9  CL 101 105 108 105 102  CO2 24 25 25 26 28   GLUCOSE 132* 131* 108* 118* 116*  BUN 8 10 13 13 12   CREATININE 0.72 0.47 0.51 0.47 0.59   CALCIUM 8.6* 8.3* 8.5* 8.3* 8.7*  MG  --  2.1 2.0 1.9  --   PHOS  --   --  3.2 3.8 4.0   GFR: Estimated Creatinine Clearance: 129.9 mL/min (by C-G formula based on SCr of 0.59 mg/dL). Liver Function Tests: Recent Labs  Lab 02/28/20 1143 03/01/20 0509 03/02/20 0552 03/03/20 0521  AST 63* 35 33 54*  ALT 24 17 17 27   ALKPHOS 53 39 38 42  BILITOT 0.7 0.6 0.6 0.7  PROT 7.7 6.1* 6.0* 6.7  ALBUMIN 3.5 2.7* 2.9* 3.3*   No results for input(s): LIPASE, AMYLASE in the last 168 hours. No results for input(s): AMMONIA in the last 168 hours. Coagulation Profile: No results for input(s): INR, PROTIME in the last 168 hours. Cardiac Enzymes: No results for input(s): CKTOTAL, CKMB, CKMBINDEX, TROPONINI in the last 168 hours. BNP (last 3 results) No results for input(s): PROBNP in the last 8760 hours. HbA1C: No results for input(s): HGBA1C in the last 72 hours. CBG: No results for input(s): GLUCAP in the last 168 hours. Lipid Profile: No results for input(s): CHOL, HDL, LDLCALC, TRIG, CHOLHDL, LDLDIRECT in the last 72 hours. Thyroid Function Tests: No results for input(s): TSH, T4TOTAL, FREET4, T3FREE, THYROIDAB in the last 72 hours. Anemia Panel: No results for input(s): VITAMINB12, FOLATE, FERRITIN, TIBC, IRON, RETICCTPCT in the last 72 hours. Sepsis Labs: Recent Labs  Lab 02/28/20 1143 02/28/20 1339 02/29/20 0526 03/01/20 0509 03/02/20 0552  PROCALCITON  --  <0.10 <0.10 <0.10 <0.10  LATICACIDVEN 1.5 1.7  --   --   --     Recent Results (from the past 240 hour(s))  Blood Culture (routine x 2)     Status: None (Preliminary result)   Collection Time: 02/28/20  1:39 PM   Specimen: BLOOD  Result Value Ref Range Status   Specimen Description BLOOD RIGHT ANTECUBITAL  Final   Special Requests   Final    BOTTLES DRAWN AEROBIC AND ANAEROBIC Blood Culture adequate volume   Culture   Final    NO GROWTH 4 DAYS Performed at [facility], [address] [place], [facility]  [address]    Report Status PENDING  Incomplete  Blood Culture (routine x 2)     Status: None (Preliminary result)   Collection Time: 02/28/20  1:39 PM   Specimen: BLOOD  Result Value Ref Range Status   Specimen Description BLOOD LEFT ANTECUBITAL  Final   Special Requests   Final    BOTTLES DRAWN AEROBIC AND ANAEROBIC Blood Culture results may not be optimal due to an inadequate volume of blood received in culture bottles   Culture   Final    NO GROWTH 4 DAYS Performed at [facility], [address], [place], [facility] [address]    Report  Status PENDING  Incomplete  Urine Culture     Status: Abnormal   Collection Time: 02/28/20  1:41 PM   Specimen: Urine, Random  Result Value Ref Range Status   Specimen Description   Final    URINE, RANDOM Performed at [facility], [address] [place], [place] [address]    Special Requests   Final    NONE Performed at [facility], [address] [place], [place] [address]    Culture >=100,000 COLONIES/mL ESCHERICHIA COLI (A)  Final   Report Status 03/02/2020 FINAL  Final   Organism ID, Bacteria ESCHERICHIA COLI (A)  Final      Susceptibility   Escherichia coli - MIC*    AMPICILLIN <=2 SENSITIVE Sensitive     CEFAZOLIN <=4 SENSITIVE Sensitive     CEFTRIAXONE <=0.25 SENSITIVE Sensitive     CIPROFLOXACIN <=0.25 SENSITIVE Sensitive     GENTAMICIN <=1 SENSITIVE Sensitive     IMIPENEM <=0.25 SENSITIVE Sensitive     NITROFURANTOIN <=16 SENSITIVE Sensitive     TRIMETH/SULFA <=20 SENSITIVE Sensitive     AMPICILLIN/SULBACTAM <=2 SENSITIVE Sensitive     PIP/TAZO <=4 SENSITIVE Sensitive     * >=100,000 COLONIES/mL ESCHERICHIA COLI  SARS Coronavirus 2 by RT PCR (hospital order, performed in [facility] hospital lab) Nasopharyngeal Nasopharyngeal Swab     Status: Abnormal   Collection Time: 02/28/20  5:21 PM   Specimen: Nasopharyngeal Swab  Result Value Ref Range Status   SARS Coronavirus 2 POSITIVE (A) NEGATIVE Final     Comment: RESULT CALLED TO, READ BACK BY AND VERIFIED WITH: JADEKA MANGRUM @1844  ON 02/28/20 SKL (NOTE) SARS-CoV-2 target nucleic acids are DETECTED  SARS-CoV-2 RNA is generally detectable in upper respiratory specimens  during the acute phase of infection.  Positive results are indicative  of the presence of the identified virus, but do not rule out bacterial infection or co-infection with other pathogens not detected by the test.  Clinical correlation with patient history and  other diagnostic information is necessary to determine patient infection status.  The expected result is negative.  Fact Sheet for Patients:   04/29/20   Fact Sheet for Healthcare Providers:   BoilerBrush.com.cy    This test is not yet approved or cleared by the https://pope.com/ FDA and  has been authorized for detection and/or diagnosis of SARS-CoV-2 by FDA under an Emergency Use Authorization (EUA).  This EUA will remain in effect (meaning this t est can be used) for the duration of  the COVID-19 declaration under Section 564(b)(1) of the Act, 21 U.S.C. section 360-bbb-3(b)(1), unless the authorization is terminated or revoked sooner.  Performed at [facility], [address] [place], [place]      Radiology Studies: No results found.  Scheduled Meds:  vitamin C  500 mg Oral Daily   dexamethasone  6 mg Oral Daily   enoxaparin (LOVENOX) injection  50 mg Subcutaneous Q24H   Ipratropium-Albuterol  1 puff Inhalation Q6H   zinc sulfate  220 mg Oral Daily   Continuous Infusions:    LOS: 4 days   Time spent: 35 minutes.  [id number], MD [facility]  If 7PM-7AM, please contact night-coverage [email or web address]  03/03/2020, 2:48 PM   This record has been created using 03/05/2020. Errors have been sought and corrected,but may not always be located. Such creation errors do not reflect on  the standard of care.

## 2020-03-04 DIAGNOSIS — U071 COVID-19: Principal | ICD-10-CM

## 2020-03-04 DIAGNOSIS — J1282 Pneumonia due to coronavirus disease 2019: Secondary | ICD-10-CM

## 2020-03-04 DIAGNOSIS — E876 Hypokalemia: Secondary | ICD-10-CM

## 2020-03-04 DIAGNOSIS — J9601 Acute respiratory failure with hypoxia: Secondary | ICD-10-CM

## 2020-03-04 LAB — COMPREHENSIVE METABOLIC PANEL
ALT: 45 U/L — ABNORMAL HIGH (ref 0–44)
AST: 63 U/L — ABNORMAL HIGH (ref 15–41)
Albumin: 3.3 g/dL — ABNORMAL LOW (ref 3.5–5.0)
Alkaline Phosphatase: 48 U/L (ref 38–126)
Anion gap: 9 (ref 5–15)
BUN: 15 mg/dL (ref 6–20)
CO2: 25 mmol/L (ref 22–32)
Calcium: 9 mg/dL (ref 8.9–10.3)
Chloride: 104 mmol/L (ref 98–111)
Creatinine, Ser: 0.62 mg/dL (ref 0.44–1.00)
GFR calc Af Amer: >60 mL/min (ref >60)
GFR calc non Af Amer: >60 mL/min (ref >60)
Glucose, Bld: 125 mg/dL — ABNORMAL HIGH (ref 70–99)
Potassium: 4 mmol/L (ref 3.5–5.1)
Sodium: 138 mmol/L (ref 135–145)
Total Bilirubin: 0.6 mg/dL (ref 0.3–1.2)
Total Protein: 6.6 g/dL (ref 6.5–8.1)

## 2020-03-04 LAB — CULTURE, BLOOD (ROUTINE X 2)
Culture: NO GROWTH
Culture: NO GROWTH
Special Requests: ADEQUATE

## 2020-03-04 LAB — PHOSPHORUS: Phosphorus: 4 mg/dL (ref 2.5–4.6)

## 2020-03-04 LAB — C-REACTIVE PROTEIN: CRP: 0.8 mg/dL (ref <1.0)

## 2020-03-04 LAB — FIBRIN DERIVATIVES D-DIMER (ARMC ONLY): Fibrin derivatives D-dimer (ARMC): 283.95 ng/mL (FEU) (ref 0.00–499.00)

## 2020-03-04 MED ORDER — GUAIFENESIN-DM 100-10 MG/5ML PO SYRP: 10.0000 mL | ORAL_SOLUTION | ORAL | 0 refills | Status: DC | PRN

## 2020-03-04 MED ORDER — IPRATROPIUM-ALBUTEROL 20-100 MCG/ACT IN AERS: 1.0000 | INHALATION_SPRAY | Freq: Four times a day (QID) | RESPIRATORY_TRACT | 0 refills | Status: DC

## 2020-03-04 MED ORDER — DEXAMETHASONE 6 MG PO TABS: 6.0000 mg | ORAL_TABLET | Freq: Every day | ORAL | 0 refills | Status: AC

## 2020-03-04 MED ORDER — ASCORBIC ACID 500 MG PO TABS: 500.0000 mg | ORAL_TABLET | Freq: Every day | ORAL | 0 refills | Status: DC

## 2020-03-04 MED ORDER — ZINC SULFATE 220 (50 ZN) MG PO CAPS: 220.0000 mg | ORAL_CAPSULE | Freq: Every day | ORAL | 0 refills | Status: DC

## 2020-03-04 NOTE — Discharge Summary (Signed)
Physician Discharge Summary  Alexis Horne NWG:[id number] DOB: January 22, 1991 DOA: 02/28/2020  PCP: Patient, No Pcp Per  Admit date: 02/28/2020 Discharge date: 03/04/2020  Admitted From: Home Disposition: Home   Recommendations for Outpatient Follow-up:  1. Follow up with PCP in 1-2 weeks 2. Please obtain BMP/CBC in one week 3. Please follow up on the following pending results:None  Home Health:No Equipment/Devices:None Discharge Condition: stable CODE STATUS: Full Diet recommendation: Heart Healthy / Carb Modified   Brief/Interim Summary: Alexis E Blevinsis a 29 y.o.femalewith medical history significant ofobesity who presented with dyspnea and cough after a COVID-19 dx. Apparently she was diagnosed more than a week ago, recently started worsening prompted her to come to ED.  Patient is unvaccinated. Patient was hypoxic and hypotensive on arrival.  Initially needing 6 L, deteriorated overnight and her oxygen requirement went up to 50 L on HF [place].  She was given Actemra x1 along with completion of remdesivir therapy.  She responded well and we were able to wean her off from oxygen with the next 5 days.  Inflammatory markers were elevated and then normalized before discharge.  She also received 1 time dose of ivermectin.  She was discharged home on 4 more days of Decadron and will follow up with her primary care provider.  Discharge Diagnoses:  Active Problems:   COVID-19   Acute respiratory failure with hypoxia (HCC)   Hypokalemia  Discharge Instructions  Discharge Instructions    Diet - low sodium heart healthy   Complete by: As directed    Discharge instructions   Complete by: As directed    It was pleasure taking care of you. You are being given steroid for 4 more days, please take it as directed. Keep your self well-hydrated. You can continue rest of your home meds and follow-up with your primary care provider.   Increase activity slowly   Complete by: As directed     MyChart COVID-19 home monitoring program   Complete by: Mar 04, 2020    Is the patient willing to use the MyChart Mobile App for home monitoring?: Yes   Temperature monitoring   Complete by: Mar 04, 2020    After how many days would you like to receive a notification of this patient's flowsheet entries?: 1     Allergies as of 03/04/2020      Reactions   Ibuprofen Swelling   Kiwi Extract    Swollen and itchy      Medication List    TAKE these medications   ascorbic acid 500 MG tablet Commonly known as: VITAMIN C Take 1 tablet (500 mg total) by mouth daily.   dexamethasone 6 MG tablet Commonly known as: DECADRON Take 1 tablet (6 mg total) by mouth daily for 4 days.   fluticasone 50 MCG/ACT nasal spray Commonly known as: FLONASE Place 2 sprays into both nostrils as needed.   guaiFENesin-dextromethorphan 100-10 MG/5ML syrup Commonly known as: ROBITUSSIN DM Take 10 mLs by mouth every 4 (four) hours as needed for cough.   Ipratropium-Albuterol 20-100 MCG/ACT Aers respimat Commonly known as: COMBIVENT Inhale 1 puff into the lungs every 6 (six) hours.   Klor-Con M20 20 MEQ tablet Generic drug: potassium chloride SA Take 20 mEq by mouth 2 (two) times daily.   levonorgestrel 20 MCG/24HR IUD Commonly known as: MIRENA 1 each by Intrauterine route once.   ondansetron 4 MG disintegrating tablet Commonly known as: ZOFRAN-ODT Take 4 mg by mouth every 8 (eight) hours as needed for nausea  or vomiting.   zinc sulfate 220 (50 Zn) MG capsule Take 1 capsule (220 mg total) by mouth daily.       Follow-up Information    [facility] Follow up.   Specialty: Primary Care Why: Follow up once discharged Contact information: [address] [place] [id number] [phone]             Allergies  Allergen Reactions  . Ibuprofen Swelling  . Kiwi Extract     Swollen and itchy     Consultations:  None  Procedures/Studies: DG Chest 2 View  Result Date: 02/19/2020 CLINICAL DATA:  Cough and chest pain EXAM: CHEST - 2 VIEW COMPARISON:  Chest x-ray dated 03/18/2018. FINDINGS: Study is hypoinspiratory with crowding of the perihilar and bibasilar bronchovascular markings. Given the low lung volumes, lungs appear clear. No confluent opacity to suggest a consolidating pneumonia. No pleural effusion or pneumothorax is seen. Osseous structures about the chest are unremarkable. IMPRESSION: Low lung volumes. No active cardiopulmonary disease. No evidence of pneumonia or pulmonary edema. Electronically Signed   By: Bary RichardStan  Maynard M.D.   On: 02/19/2020 17:15   CT Angio Chest PE W and/or Wo Contrast  Result Date: 02/28/2020 CLINICAL DATA:  Shortness of breath.  Recent diagnosis of COVID-19 EXAM: CT ANGIOGRAPHY CHEST WITH CONTRAST TECHNIQUE: Multidetector CT imaging of the chest was performed using the standard protocol during bolus administration of intravenous contrast. Multiplanar CT image reconstructions and MIPs were obtained to evaluate the vascular anatomy. CONTRAST:  75mL OMNIPAQUE IOHEXOL 350 MG/ML SOLN COMPARISON:  CT 03/19/2018, chest x-ray 02/28/2020 FINDINGS: Cardiovascular: Satisfactory opacification of the pulmonary arteries although evaluation of the segmental and subsegmental branches is degraded by extensive respiratory motion artifact. No evidence of pulmonary embolism to the lobar branch level. Normal heart size. No pericardial effusion. Thoracic aorta is normal in course and caliber. Mediastinum/Nodes: No enlarged mediastinal, hilar, or axillary lymph nodes. Thyroid gland, trachea, and esophagus demonstrate no significant findings. Lungs/Pleura: Extensive patchy airspace consolidations throughout both lungs. No pleural effusion or pneumothorax. Upper Abdomen: Diffuse hepatic steatosis. Musculoskeletal: No chest wall abnormality. No acute or significant osseous findings.  Review of the MIP images confirms the above findings. IMPRESSION: 1. No evidence of pulmonary embolism to the lobar branch level. 2. Extensive patchy airspace consolidations throughout both lungs, consistent with multifocal pneumonia in the setting of COVID-19 infection. 3. Diffuse hepatic steatosis. Electronically Signed   By: Duanne GuessNicholas  Plundo D.O.   On: 02/28/2020 17:01   DG Chest Port 1 View  Result Date: 02/28/2020 CLINICAL DATA:  Shortness of breath EXAM: PORTABLE CHEST 1 VIEW COMPARISON:  February 19, 2020 FINDINGS: There is airspace opacity throughout the lungs bilaterally. Heart is upper normal in size with pulmonary vascularity normal. No adenopathy. No bone lesions. IMPRESSION: Extensive airspace opacity throughout the lungs bilaterally. Atypical organism pneumonia most likely etiology given this appearance. Heart upper normal in size. No adenopathy appreciable. Electronically Signed   By: Bretta BangWilliam  Woodruff III M.D.   On: 02/28/2020 13:16    Subjective: Patient has no new complaints.  Discharge Exam: Vitals:   03/04/20 0427 03/04/20 1018  BP: 100/65 (!) 141/86  Pulse: 72 91  Resp: 16 (!) 21  Temp: 98.1 F (36.7 C) 98.6 F (37 C)  SpO2: 96% 94%   Vitals:   03/03/20 2052 03/04/20 0204 03/04/20 0427 03/04/20 1018  BP: 122/84 122/90 100/65 (!) 141/86  Pulse: 89 92 72 91  Resp: 16 16 16  (!)  21  Temp: 98.6 F (37 C) 98.7 F (37.1 C) 98.1 F (36.7 C) 98.6 F (37 C)  TempSrc: Oral Oral Oral Oral  SpO2: 94% 95% 96% 94%  Weight:      Height:        General: Pt is alert, awake, not in acute distress Cardiovascular: RRR, S1/S2 +, no rubs, no gallops Respiratory: CTA bilaterally, no wheezing, no rhonchi Abdominal: Soft, NT, ND, bowel sounds + Extremities: no edema, no cyanosis   The results of significant diagnostics from this hospitalization (including imaging, microbiology, ancillary and laboratory) are listed below for reference.    Microbiology: Recent Results (from the  past 240 hour(s))  Blood Culture (routine x 2)     Status: None   Collection Time: 02/28/20  1:39 PM   Specimen: BLOOD  Result Value Ref Range Status   Specimen Description BLOOD RIGHT ANTECUBITAL  Final   Special Requests   Final    BOTTLES DRAWN AEROBIC AND ANAEROBIC Blood Culture adequate volume   Culture   Final    NO GROWTH 5 DAYS Performed at [facility], [address] [place], [place] [address]    Report Status 03/04/2020 FINAL  Final  Blood Culture (routine x 2)     Status: None   Collection Time: 02/28/20  1:39 PM   Specimen: BLOOD  Result Value Ref Range Status   Specimen Description BLOOD LEFT ANTECUBITAL  Final   Special Requests   Final    BOTTLES DRAWN AEROBIC AND ANAEROBIC Blood Culture results may not be optimal due to an inadequate volume of blood received in culture bottles   Culture   Final    NO GROWTH 5 DAYS Performed at [facility], [address] [place], [place] [address]    Report Status 03/04/2020 FINAL  Final  Urine Culture     Status: Abnormal   Collection Time: 02/28/20  1:41 PM   Specimen: Urine, Random  Result Value Ref Range Status   Specimen Description   Final    URINE, RANDOM Performed at [facility], [address] [place], [place] [address]    Special Requests   Final    NONE Performed at [facility], [address] [place], [place] [address]    Culture >=100,000 COLONIES/mL ESCHERICHIA COLI (A)  Final   Report Status 03/02/2020 FINAL  Final   Organism ID, Bacteria ESCHERICHIA COLI (A)  Final      Susceptibility   Escherichia coli - MIC*    AMPICILLIN <=2 SENSITIVE Sensitive     CEFAZOLIN <=4 SENSITIVE Sensitive     CEFTRIAXONE <=0.25 SENSITIVE Sensitive     CIPROFLOXACIN <=0.25 SENSITIVE Sensitive     GENTAMICIN <=1 SENSITIVE Sensitive     IMIPENEM <=0.25 SENSITIVE Sensitive     NITROFURANTOIN <=16 SENSITIVE Sensitive     TRIMETH/SULFA <=20 SENSITIVE Sensitive      AMPICILLIN/SULBACTAM <=2 SENSITIVE Sensitive     PIP/TAZO <=4 SENSITIVE Sensitive     * >=100,000 COLONIES/mL ESCHERICHIA COLI  SARS Coronavirus 2 by RT PCR (hospital order, performed in [facility] hospital lab) Nasopharyngeal Nasopharyngeal Swab     Status: Abnormal   Collection Time: 02/28/20  5:21 PM   Specimen: Nasopharyngeal Swab  Result Value Ref Range Status   SARS Coronavirus 2 POSITIVE (A) NEGATIVE Final    Comment: RESULT CALLED TO, READ BACK BY AND VERIFIED WITH: JADEKA MANGRUM @1844  ON 02/28/20 SKL (NOTE) SARS-CoV-2 target nucleic acids are DETECTED  SARS-CoV-2 RNA  is generally detectable in upper respiratory specimens  during the acute phase of infection.  Positive results are indicative  of the presence of the identified virus, but do not rule out bacterial infection or co-infection with other pathogens not detected by the test.  Clinical correlation with patient history and  other diagnostic information is necessary to determine patient infection status.  The expected result is negative.  Fact Sheet for Patients:   BoilerBrush.com.cy   Fact Sheet for Healthcare Providers:   https://pope.com/    This test is not yet approved or cleared by the [place] FDA and  has been authorized for detection and/or diagnosis of SARS-CoV-2 by FDA under an Emergency Use Authorization (EUA).  This EUA will remain in effect (meaning this t est can be used) for the duration of  the COVID-19 declaration under Section 564(b)(1) of the Act, 21 U.S.C. section 360-bbb-3(b)(1), unless the authorization is terminated or revoked sooner.  Performed at [facility], [address] [place], [place] [address]      Labs: BNP (last 3 results) No results for input(s): BNP in the last 8760 hours. Basic Metabolic Panel: Recent Labs  Lab 02/29/20 0526 03/01/20 0509 03/02/20 0552 03/03/20 0521 03/04/20 0649  NA 139 141 139 138  138  K 3.1* 3.8 3.7 3.9 4.0  CL 105 108 105 102 104  CO2 25 25 26 28 25   GLUCOSE 131* 108* 118* 116* 125*  BUN 10 13 13 12 15   CREATININE 0.47 0.51 0.47 0.59 0.62  CALCIUM 8.3* 8.5* 8.3* 8.7* 9.0  MG 2.1 2.0 1.9  --   --   PHOS  --  3.2 3.8 4.0 4.0   Liver Function Tests: Recent Labs  Lab 02/28/20 1143 03/01/20 0509 03/02/20 0552 03/03/20 0521 03/04/20 0649  AST 63* 35 33 54* 63*  ALT 24 17 17 27  45*  ALKPHOS 53 39 38 42 48  BILITOT 0.7 0.6 0.6 0.7 0.6  PROT 7.7 6.1* 6.0* 6.7 6.6  ALBUMIN 3.5 2.7* 2.9* 3.3* 3.3*   No results for input(s): LIPASE, AMYLASE in the last 168 hours. No results for input(s): AMMONIA in the last 168 hours. CBC: Recent Labs  Lab 02/28/20 1143 02/29/20 0526 03/01/20 0509 03/02/20 0552  WBC 5.3 4.2 5.3 5.7  NEUTROABS 3.8  --   --   --   HGB 14.7 12.7 12.1 12.1  HCT 42.7 36.1 35.1* 35.1*  MCV 88.4 88.3 88.9 89.3  PLT 262 264 318 325   Cardiac Enzymes: No results for input(s): CKTOTAL, CKMB, CKMBINDEX, TROPONINI in the last 168 hours. BNP: Invalid input(s): POCBNP CBG: No results for input(s): GLUCAP in the last 168 hours. D-Dimer No results for input(s): DDIMER in the last 72 hours. Hgb A1c No results for input(s): HGBA1C in the last 72 hours. Lipid Profile No results for input(s): CHOL, HDL, LDLCALC, TRIG, CHOLHDL, LDLDIRECT in the last 72 hours. Thyroid function studies No results for input(s): TSH, T4TOTAL, T3FREE, THYROIDAB in the last 72 hours.  Invalid input(s): FREET3 Anemia work up No results for input(s): VITAMINB12, FOLATE, FERRITIN, TIBC, IRON, RETICCTPCT in the last 72 hours. Urinalysis    Component Value Date/Time   COLORURINE AMBER (A) 02/28/2020 1341   APPEARANCEUR CLOUDY (A) 02/28/2020 1341   APPEARANCEUR Cloudy 01/13/2014 1011   LABSPEC >1.046 (H) 02/28/2020 1341   LABSPEC 1.020 01/13/2014 1011   PHURINE 5.0 02/28/2020 1341   GLUCOSEU NEGATIVE 02/28/2020 1341   GLUCOSEU Negative 01/13/2014 1011   HGBUR LARGE  (  A) 02/28/2020 1341   BILIRUBINUR NEGATIVE 02/28/2020 1341   BILIRUBINUR Negative 01/13/2014 1011   KETONESUR NEGATIVE 02/28/2020 1341   PROTEINUR 100 (A) 02/28/2020 1341   UROBILINOGEN 1.0 04/24/2008 1145   NITRITE NEGATIVE 02/28/2020 1341   LEUKOCYTESUR TRACE (A) 02/28/2020 1341   LEUKOCYTESUR Negative 01/13/2014 1011   Sepsis Labs Invalid input(s): PROCALCITONIN,  WBC,  LACTICIDVEN Microbiology Recent Results (from the past 240 hour(s))  Blood Culture (routine x 2)     Status: None   Collection Time: 02/28/20  1:39 PM   Specimen: BLOOD  Result Value Ref Range Status   Specimen Description BLOOD RIGHT ANTECUBITAL  Final   Special Requests   Final    BOTTLES DRAWN AEROBIC AND ANAEROBIC Blood Culture adequate volume   Culture   Final    NO GROWTH 5 DAYS Performed at [facility], [address] [place], [place] [address]    Report Status 03/04/2020 FINAL  Final  Blood Culture (routine x 2)     Status: None   Collection Time: 02/28/20  1:39 PM   Specimen: BLOOD  Result Value Ref Range Status   Specimen Description BLOOD LEFT ANTECUBITAL  Final   Special Requests   Final    BOTTLES DRAWN AEROBIC AND ANAEROBIC Blood Culture results may not be optimal due to an inadequate volume of blood received in culture bottles   Culture   Final    NO GROWTH 5 DAYS Performed at [facility], [address] [place], [place] [address]    Report Status 03/04/2020 FINAL  Final  Urine Culture     Status: Abnormal   Collection Time: 02/28/20  1:41 PM   Specimen: Urine, Random  Result Value Ref Range Status   Specimen Description   Final    URINE, RANDOM Performed at [facility], [address] [place], [place] [address]    Special Requests   Final    NONE Performed at [facility], [address] [place], [place] [address]    Culture >=100,000 COLONIES/mL ESCHERICHIA COLI (A)  Final   Report Status 03/02/2020 FINAL  Final   Organism ID,  Bacteria ESCHERICHIA COLI (A)  Final      Susceptibility   Escherichia coli - MIC*    AMPICILLIN <=2 SENSITIVE Sensitive     CEFAZOLIN <=4 SENSITIVE Sensitive     CEFTRIAXONE <=0.25 SENSITIVE Sensitive     CIPROFLOXACIN <=0.25 SENSITIVE Sensitive     GENTAMICIN <=1 SENSITIVE Sensitive     IMIPENEM <=0.25 SENSITIVE Sensitive     NITROFURANTOIN <=16 SENSITIVE Sensitive     TRIMETH/SULFA <=20 SENSITIVE Sensitive     AMPICILLIN/SULBACTAM <=2 SENSITIVE Sensitive     PIP/TAZO <=4 SENSITIVE Sensitive     * >=100,000 COLONIES/mL ESCHERICHIA COLI  SARS Coronavirus 2 by RT PCR (hospital order, performed in [facility] hospital lab) Nasopharyngeal Nasopharyngeal Swab     Status: Abnormal   Collection Time: 02/28/20  5:21 PM   Specimen: Nasopharyngeal Swab  Result Value Ref Range Status   SARS Coronavirus 2 POSITIVE (A) NEGATIVE Final    Comment: RESULT CALLED TO, READ BACK BY AND VERIFIED WITH: JADEKA MANGRUM @1844  ON 02/28/20 SKL (NOTE) SARS-CoV-2 target nucleic acids are DETECTED  SARS-CoV-2 RNA is generally detectable in upper respiratory specimens  during the acute phase of infection.  Positive results are indicative  of the presence of the identified virus, but do not rule out bacterial infection or co-infection with other pathogens not detected by the  test.  Clinical correlation with patient history and  other diagnostic information is necessary to determine patient infection status.  The expected result is negative.  Fact Sheet for Patients:   BoilerBrush.com.cy   Fact Sheet for Healthcare Providers:   https://pope.com/    This test is not yet approved or cleared by the [place] FDA and  has been authorized for detection and/or diagnosis of SARS-CoV-2 by FDA under an Emergency Use Authorization (EUA).  This EUA will remain in effect (meaning this t est can be used) for the duration of  the COVID-19 declaration under Section  564(b)(1) of the Act, 21 U.S.C. section 360-bbb-3(b)(1), unless the authorization is terminated or revoked sooner.  Performed at [facility], [address] [place], [place] [address]     Time coordinating discharge: Over 30 minutes  SIGNED:  Arnetha Courser, MD  [facility] 03/04/2020, 10:54 AM  If 7PM-7AM, please contact night-coverage [email or web address]  This record has been created using Conservation officer, historic buildings. Errors have been sought and corrected,but may not always be located. Such creation errors do not reflect on the standard of care.

## 2020-03-04 NOTE — Progress Notes (Signed)
Pt is being discharged home.  Discharge papers given and explained to pt. Pt verbalized understanding.  Meds and f/u appointment reviewed. Rx sent electronically to pharmacy.  Pt made aware. Awaiting transportation.  

## 2020-04-18 ENCOUNTER — Ambulatory Visit: Payer: Self-pay | Admitting: Obstetrics & Gynecology

## 2020-09-27 ENCOUNTER — Encounter: Payer: Self-pay | Admitting: Physician Assistant

## 2020-09-27 ENCOUNTER — Ambulatory Visit (LOCAL_COMMUNITY_HEALTH_CENTER): Payer: Medicaid Other | Admitting: Physician Assistant

## 2020-09-27 ENCOUNTER — Other Ambulatory Visit: Payer: Self-pay

## 2020-09-27 VITALS — BP 129/87 | HR 72 | Ht 63.0 in | Wt 259.4 lb

## 2020-09-27 DIAGNOSIS — Z30432 Encounter for removal of intrauterine contraceptive device: Secondary | ICD-10-CM | POA: Diagnosis not present

## 2020-09-27 DIAGNOSIS — Z3009 Encounter for other general counseling and advice on contraception: Secondary | ICD-10-CM

## 2020-09-27 NOTE — Progress Notes (Signed)
S: pt here for IUD removal only. Reports had Mirena IUD insertion after delivery of son Oct 2019 at postpartum visit by [facility]. Has some headaches and dizziness that she attributes to the IUD, and is planning another pregnancy in a few months and desires removal. Nonsmoker, no drug or alcohol use. Not taking any meds or supplements, including folic acid. Cannot feel IUD strings. O: Pleasant obese WF in NAD. A: Desires IUD removal/intends pregnancy.  P: IUD Removal  Patient identified, informed consent performed, consent signed.  Patient was in the dorsal lithotomy position, normal external genitalia was noted.  A speculum was placed in the patient's vagina, normal discharge was noted, no lesions. The cervix was visualized, no lesions, no abnormal discharge.  The strings of the IUD were grasped and pulled using ring forceps. The IUD was removed in its entirety.   Patient tolerated the procedure well.  Encouraged ibuprofen per package instructions for 1-2 days prn pelvic cramping. Work note provided for tomorrow in case she does not feel well enough to work.  Patient plans for pregnancy soon and she was told to avoid teratogens, take PNV and folic acid.  Routine preventative health maintenance measures emphasized.  Counseled that HA and dizziness sx may not be related to IUD use, and if they continue despite removal, she should have primary care provider eval.Enc pt to RTC within 1-2 mo for routine well-woman care, physical and Pap, or for prenatal care if pregnant. Pt states understanding.

## 2020-09-27 NOTE — Progress Notes (Signed)
Presents for IUD removal. Consents signed. Desires condoms, and is planning possible pregnancy. Patient aware that physical and PAP due today, but declined. States that she will schedule on future date. IUD removed, tolerated well. Sharlyne Pacas, RN

## 2020-09-28 ENCOUNTER — Ambulatory Visit: Payer: Self-pay

## 2020-10-03 ENCOUNTER — Telehealth: Payer: Self-pay | Admitting: Family Medicine

## 2020-10-03 NOTE — Telephone Encounter (Signed)
RN consulted with M.Garner FNP about symptoms and blood clots. Provider recommends patient goes to ER. RN returned call to patient. Patient instructed to leave work early to go to ER. Patient verbalized understanding of plan. All questions answered.  Harvie Heck, RN

## 2020-10-03 NOTE — Telephone Encounter (Signed)
Call to patient. Patient reports passing blood clots the size of her fist since Saturday, IUD removed last Thursday. Patient is at work and is reporting feeling sick on the stomach, dizzy, and sweating. Patient denies trying ibuprofen for bleeding and reports allergy to ibuprofen. RN recommended that she go to urgent care for symptoms to be seen today. RN also told patient she would consult with provider to see what they recommend about bleeding and see if symptoms are related to IUD removal.  Harvie Heck, RN

## 2020-10-03 NOTE — Telephone Encounter (Signed)
I have blood clots the size of my fist after the removal of my IUD.

## 2021-10-19 DIAGNOSIS — N200 Calculus of kidney: Secondary | ICD-10-CM

## 2021-10-19 HISTORY — DX: Calculus of kidney: N20.0

## 2021-10-31 ENCOUNTER — Emergency Department
Admission: EM | Admit: 2021-10-31 | Discharge: 2021-10-31 | Disposition: A | Discharge status: Home / Self Care | Payer: Self-pay | Attending: Emergency Medicine | Admitting: Emergency Medicine

## 2021-10-31 ENCOUNTER — Other Ambulatory Visit: Payer: Self-pay

## 2021-10-31 ENCOUNTER — Encounter: Payer: Self-pay | Admitting: Emergency Medicine

## 2021-10-31 DIAGNOSIS — R109 Unspecified abdominal pain: Secondary | ICD-10-CM

## 2021-10-31 DIAGNOSIS — N926 Irregular menstruation, unspecified: Secondary | ICD-10-CM | POA: Insufficient documentation

## 2021-10-31 DIAGNOSIS — R319 Hematuria, unspecified: Secondary | ICD-10-CM | POA: Insufficient documentation

## 2021-10-31 LAB — COMPREHENSIVE METABOLIC PANEL
ALT: 11 U/L (ref 0–44)
AST: 19 U/L (ref 15–41)
Albumin: 3.8 g/dL (ref 3.5–5.0)
Alkaline Phosphatase: 54 U/L (ref 38–126)
Anion gap: 3 — ABNORMAL LOW (ref 5–15)
BUN: 11 mg/dL (ref 6–20)
CO2: 26 mmol/L (ref 22–32)
Calcium: 8.9 mg/dL (ref 8.9–10.3)
Chloride: 106 mmol/L (ref 98–111)
Creatinine, Ser: 0.71 mg/dL (ref 0.44–1.00)
GFR, Estimated: >60 mL/min (ref >60)
Glucose, Bld: 104 mg/dL — ABNORMAL HIGH (ref 70–99)
Potassium: 3.9 mmol/L (ref 3.5–5.1)
Sodium: 135 mmol/L (ref 135–145)
Total Bilirubin: 0.6 mg/dL (ref 0.3–1.2)
Total Protein: 7.1 g/dL (ref 6.5–8.1)

## 2021-10-31 LAB — URINALYSIS, ROUTINE W REFLEX MICROSCOPIC
RBC / HPF: >50 RBC/hpf — ABNORMAL HIGH (ref 0–5)
Specific Gravity, Urine: 1.019 (ref 1.005–1.030)

## 2021-10-31 LAB — CBC
HCT: 37.7 % (ref 36.0–46.0)
Hemoglobin: 12.4 g/dL (ref 12.0–15.0)
MCH: 29.5 pg (ref 26.0–34.0)
MCHC: 32.9 g/dL (ref 30.0–36.0)
MCV: 89.5 fL (ref 80.0–100.0)
Platelets: 240 10*3/uL (ref 150–400)
RBC: 4.21 MIL/uL (ref 3.87–5.11)
RDW: 11.9 % (ref 11.5–15.5)
WBC: 5.3 10*3/uL (ref 4.0–10.5)
nRBC: 0 % (ref 0.0–0.2)

## 2021-10-31 LAB — POC URINE PREG, ED: Preg Test, Ur: NEGATIVE

## 2021-10-31 LAB — LIPASE, BLOOD: Lipase: 33 U/L (ref 11–51)

## 2021-10-31 NOTE — ED Triage Notes (Signed)
Pt comes into the ED via POV c/o flank pain bilaterally.  [place] and they dx her with kidney infections, kidney stones, and a ovarian cyst.  Pt states she has been taking the medication as prescribed, but the pain isnt getting any better.  ?

## 2021-10-31 NOTE — Discharge Instructions (Signed)
Use Tylenol for pain and fevers.  Up to 1000 mg per dose, up to 4 times per day.  Do not take more than 4000 mg of Tylenol/acetaminophen within 24 hours.. ? ?Please finish your course of Keflex antibiotic, finish all pills even if you are feeling better. ?

## 2021-10-31 NOTE — ED Provider Notes (Signed)
? ?[facility] ?Provider Note ? ? ? Event Date/Time  ? First MD Initiated Contact with Patient 10/31/21 [phone]   ?  (approximate) ? ? ?History  ? ?Flank Pain ? ? ?HPI ? ?Alexis Horne is a 31 y.o. female who presents to the ED for evaluation of Flank Pain ?  ?Review outside ED visit from 4/8 for patient was seen for left flank pain.  Diagnosed with a left ureteral stone and acute cystitis.  2 mm left UPJ stone with minimal obstruction.  Discharged with Keflex 3 times daily for 10 days. ? ?Patient presents to the ED for evaluation of vaginal bleeding and soreness to her LLQ abdomen.  She reports she has had no fevers since starting antibiotics and overall feeling better.  Denies emesis, diarrhea, nausea, dysuria or hematuria.  Reports feeling pain to her LLQ abdomen, improved with home Norco. ? ?She reports developing vaginal bleeding on Sunday, 4 days ago.  LMP was about 2 months ago, she has had irregular menstrual periods since she had her IUD taken out last year. ? ?Denies any sudden or severe abdominal pain, novel vaginal discharge beyond her bleeding.  Reports it does feel somewhat like her previous menstrual periods. ? ? ?Physical Exam  ? ?Triage Vital Signs: ?ED Triage Vitals  ?Enc Vitals Group  ?   BP 10/31/21 0825 (!) 132/93  ?   Pulse Rate 10/31/21 0825 62  ?   Resp 10/31/21 0825 17  ?   Temp 10/31/21 0825 98 ?F (36.7 ?C)  ?   Temp Source 10/31/21 0825 Oral  ?   SpO2 10/31/21 0825 97 %  ?   Weight 10/31/21 0821 259 lb 7.7 oz (117.7 kg)  ?   Height 10/31/21 0821 5\' 3"  (1.6 m)  ?   Head Circumference --   ?   Peak Flow --   ?   Pain Score 10/31/21 0821 6  ?   Pain Loc --   ?   Pain Edu? --   ?   Excl. in [place]? --   ? ? ?Most recent vital signs: ?Vitals:  ? 10/31/21 0825  ?BP: (!) 132/93  ?Pulse: 62  ?Resp: 17  ?Temp: 98 ?F (36.7 ?C)  ?SpO2: 97%  ? ? ?General: Awake, no distress.  Overly obese, well-appearing, pleasant and conversational. ?CV:  Good peripheral perfusion.  ?Resp:  Normal  effort.  ?Abd:  No distention.  Minimal LLQ and suprapubic tenderness to palpation without guarding or peritoneal features.  Otherwise benign examination.  No CVA tenderness bilaterally. ?MSK:  No deformity noted.  ?Neuro:  No focal deficits appreciated. ?Other:   ? ? ?ED Results / Procedures / Treatments  ? ?Labs ?(all labs ordered are listed, but only abnormal results are displayed) ?Labs Reviewed  ?URINALYSIS, ROUTINE W REFLEX MICROSCOPIC - Abnormal; Notable for the following components:  ?    Result Value  ? Color, Urine RED (*)   ? APPearance CLOUDY (*)   ? Glucose, UA   (*)   ? Value: TEST NOT REPORTED DUE TO COLOR INTERFERENCE OF URINE PIGMENT  ? Hgb urine dipstick   (*)   ? Value: TEST NOT REPORTED DUE TO COLOR INTERFERENCE OF URINE PIGMENT  ? Bilirubin Urine   (*)   ? Value: TEST NOT REPORTED DUE TO COLOR INTERFERENCE OF URINE PIGMENT  ? Ketones, ur   (*)   ? Value: TEST NOT REPORTED DUE TO COLOR INTERFERENCE OF URINE PIGMENT  ? Protein, ur   (*)   ?  Value: TEST NOT REPORTED DUE TO COLOR INTERFERENCE OF URINE PIGMENT  ? Nitrite   (*)   ? Value: TEST NOT REPORTED DUE TO COLOR INTERFERENCE OF URINE PIGMENT  ? Leukocytes,Ua   (*)   ? Value: TEST NOT REPORTED DUE TO COLOR INTERFERENCE OF URINE PIGMENT  ? RBC / HPF >50 (*)   ? Bacteria, UA RARE (*)   ? All other components within normal limits  ?COMPREHENSIVE METABOLIC PANEL - Abnormal; Notable for the following components:  ? Glucose, Bld 104 (*)   ? Anion gap 3 (*)   ? All other components within normal limits  ?CBC  ?LIPASE, BLOOD  ?POC URINE PREG, ED  ? ? ?EKG ? ? ?RADIOLOGY ? ? ?Official radiology report(s): ?No results found. ? ?PROCEDURES and INTERVENTIONS: ? ?Procedures ? ?Medications - No data to display ? ? ?IMPRESSION / MDM / ASSESSMENT AND PLAN / ED COURSE  ?I reviewed the triage vital signs and the nursing notes. ? ?Pleasant 31 year old female being treated for acute cystitis presents to the ED with vaginal bleeding and LLQ abdominal pain, likely  just initiation of her menstrual period and ultimately suitable for outpatient management.  She look systemically well and has normal vital signs.  Minimal tenderness to the LLQ without peritoneal features or guarding.  Overall very benign examination.  Blood work is similarly reassuring with normal CBC, metabolic panel and lipase.  She is not pregnant and urine with hematuria likely from her vaginal discharge.  I suspect this discharge is her menstrual period.  I discussed with her imaging, but we come to a shared decision not to pursue repeat imaging considering her reassuring work-up.  We discussed finishing her Keflex course and return precautions for the ED.  Doubt ovarian torsion, retained and infected stone. ? ?Clinical Course as of 10/31/21 1053  ?Thu Oct 31, 2021  ?[id number] Discussed plan of care with the patient.  We discussed reassuring examination and vital signs.  We discussed awaiting blood work and urinalysis prior to any repeat CT scans considering her overall improving symptoms.  She is in agreement. [DS]  ?1050 Reassessed.  Patient reports feeling fine.  We discussed reassuring work-up and shared decision-making yields plan to pursue outpatient management.  We discussed return precautions. [DS]  ?  ?Clinical Course User Index ?[DS] Vladimir Crofts, MD  ? ? ? ?FINAL CLINICAL IMPRESSION(S) / ED DIAGNOSES  ? ?Final diagnoses:  ?Left flank pain  ?Irregular periods/menstrual cycles  ? ? ? ?Rx / DC Orders  ? ?ED Discharge Orders   ? ? None  ? ?  ? ? ? ?Note:  This document was prepared using Dragon voice recognition software and may include unintentional dictation errors. ?  ?Vladimir Crofts, MD ?10/31/21 1054 ? ?

## 2021-10-31 NOTE — ED Notes (Addendum)
DC delayed due to pt needing work note. Pt given work note, pt states she feels fine to walk to lobby. Gait steady. ?

## 2022-08-05 ENCOUNTER — Emergency Department
Admission: EM | Admit: 2022-08-05 | Discharge: 2022-08-05 | Disposition: A | Discharge status: Home / Self Care | Payer: Medicaid Other | Attending: Student in an Organized Health Care Education/Training Program | Admitting: Student in an Organized Health Care Education/Training Program

## 2022-08-05 DIAGNOSIS — R519 Headache, unspecified: Secondary | ICD-10-CM

## 2022-08-05 DIAGNOSIS — Z1152 Encounter for screening for COVID-19: Secondary | ICD-10-CM | POA: Insufficient documentation

## 2022-08-05 DIAGNOSIS — R112 Nausea with vomiting, unspecified: Secondary | ICD-10-CM

## 2022-08-05 LAB — URINALYSIS, ROUTINE W REFLEX MICROSCOPIC
Bacteria, UA: NONE SEEN
Bilirubin Urine: NEGATIVE
Glucose, UA: NEGATIVE mg/dL
Ketones, ur: 5 mg/dL — AB
Nitrite: NEGATIVE
Protein, ur: NEGATIVE mg/dL
Specific Gravity, Urine: 1.027 (ref 1.005–1.030)
pH: 5 (ref 5.0–8.0)

## 2022-08-05 LAB — COMPREHENSIVE METABOLIC PANEL
ALT: 11 U/L (ref 0–44)
AST: 18 U/L (ref 15–41)
Albumin: 4 g/dL (ref 3.5–5.0)
Alkaline Phosphatase: 65 U/L (ref 38–126)
Anion gap: 6 (ref 5–15)
BUN: 9 mg/dL (ref 6–20)
CO2: 22 mmol/L (ref 22–32)
Calcium: 8.6 mg/dL — ABNORMAL LOW (ref 8.9–10.3)
Chloride: 107 mmol/L (ref 98–111)
Creatinine, Ser: 0.62 mg/dL (ref 0.44–1.00)
GFR, Estimated: >60 mL/min (ref >60)
Glucose, Bld: 88 mg/dL (ref 70–99)
Potassium: 3.6 mmol/L (ref 3.5–5.1)
Sodium: 135 mmol/L (ref 135–145)
Total Bilirubin: 0.5 mg/dL (ref 0.3–1.2)
Total Protein: 7.5 g/dL (ref 6.5–8.1)

## 2022-08-05 LAB — CBC WITH DIFFERENTIAL/PLATELET
Abs Immature Granulocytes: 0.02 10*3/uL (ref 0.00–0.07)
Basophils Absolute: 0 10*3/uL (ref 0.0–0.1)
Basophils Relative: 0 %
Eosinophils Absolute: 0.1 10*3/uL (ref 0.0–0.5)
Eosinophils Relative: 1 %
HCT: 39.5 % (ref 36.0–46.0)
Hemoglobin: 13.4 g/dL (ref 12.0–15.0)
Immature Granulocytes: 0 %
Lymphocytes Relative: 23 %
Lymphs Abs: 2.2 10*3/uL (ref 0.7–4.0)
MCH: 29.8 pg (ref 26.0–34.0)
MCHC: 33.9 g/dL (ref 30.0–36.0)
MCV: 88 fL (ref 80.0–100.0)
Monocytes Absolute: 0.6 10*3/uL (ref 0.1–1.0)
Monocytes Relative: 6 %
Neutro Abs: 6.7 10*3/uL (ref 1.7–7.7)
Neutrophils Relative %: 70 %
Platelets: 273 10*3/uL (ref 150–400)
RBC: 4.49 MIL/uL (ref 3.87–5.11)
RDW: 11.7 % (ref 11.5–15.5)
WBC: 9.6 10*3/uL (ref 4.0–10.5)
nRBC: 0 % (ref 0.0–0.2)

## 2022-08-05 LAB — RESP PANEL BY RT-PCR (RSV, FLU A&B, COVID)  RVPGX2
Influenza A by PCR: NEGATIVE
Influenza B by PCR: NEGATIVE
Resp Syncytial Virus by PCR: NEGATIVE
SARS Coronavirus 2 by RT PCR: NEGATIVE

## 2022-08-05 LAB — GROUP A STREP BY PCR: Group A Strep by PCR: NOT DETECTED

## 2022-08-05 LAB — POC URINE PREG, ED: Preg Test, Ur: NEGATIVE

## 2022-08-05 LAB — LIPASE, BLOOD: Lipase: 37 U/L (ref 11–51)

## 2022-08-05 MED ORDER — ONDANSETRON 4 MG PO TBDP
4.0000 mg | ORAL_TABLET | Freq: Once | ORAL | Status: AC
  Administered 2022-08-05: 4 mg via ORAL
  Filled 2022-08-05: qty 1

## 2022-08-05 MED ORDER — ONDANSETRON 4 MG PO TBDP: 4.0000 mg | ORAL_TABLET | Freq: Three times a day (TID) | ORAL | 0 refills | Status: AC | PRN

## 2022-08-05 NOTE — ED Triage Notes (Signed)
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 Pt endorses that her father has a condition where blood clots form in the head and it can be passed down to his children.

## 2022-08-05 NOTE — ED Notes (Signed)
See triage note. Pt came in for splitting headaches intermittently at night for about a week has also been having nausea the past couple of days. Pt took a covid test and was negative and pregnancy was also negative.

## 2022-08-05 NOTE — Discharge Instructions (Signed)
Your exam and labs are normal and reassuring at this time but no signs of an infection with COVID, flu, RSV, or strep.  You should take the nausea medicine as needed and consider taking OTC MiraLAX for constipation.  Follow with your primary provider or local urgent care for ongoing symptoms.  Take OTC along Tylenol & Motrin for headache pain.  Return to the ED if needed.

## 2022-08-05 NOTE — ED Provider Notes (Signed)
[facility] Emergency Department Provider Note     Event Date/Time   First MD Initiated Contact with Patient 08/05/22 1815     (approximate)   History   Headache   HPI  Alexis Horne is a 32 y.o. 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 She notes symptoms for the last two weeks.  Patient works in a daycare but denies any obvious sick contacts, recent travel, bad food exposure.  She denies fevers or sweats but endorses chills. She also reports cough and congestion.  Patient denies any current headache pain at this time, has not taken any medications today for symptom relief.   Physical Exam   Triage Vital Signs: ED Triage Vitals  Enc Vitals Group     BP 08/05/22 1710 (!) 146/94     Pulse Rate 08/05/22 1710 82     Resp 08/05/22 1710 18     Temp 08/05/22 1710 97.9 F (36.6 C)     Temp Source 08/05/22 1710 Oral     SpO2 08/05/22 1710 97 %     Weight 08/05/22 1711 256 lb (116.1 kg)     Height --      Head Circumference --      Peak Flow --      Pain Score 08/05/22 1711 6     Pain Loc --      Pain Edu? --      Excl. in [place]? --     Most recent vital signs: Vitals:   08/05/22 1710 08/05/22 2115  BP: (!) 146/94 (!) 150/119  Pulse: 82 80  Resp: 18 18  Temp: 97.9 F (36.6 C) 98 F (36.7 C)  SpO2: 97% 100%    General Awake, no distress. NAD HEENT NCAT. PERRL. EOMI. No rhinorrhea. Mucous membranes are moist.  Uvula is midline and tonsils are flat.  No oropharyngeal lesions appreciated. CV:  Good peripheral perfusion.  RESP:  Normal effort. CTA ABD:  No distention.  Bowel sounds appreciated.  No rebound, guarding, or rigidity noted.   ED Results / Procedures / Treatments   Labs (all labs ordered are listed, but only abnormal results are displayed) Labs Reviewed  URINALYSIS, ROUTINE W REFLEX MICROSCOPIC - Abnormal; Notable for the  following components:      Result Value   Color, Urine YELLOW (*)    APPearance HAZY (*)    Hgb urine dipstick SMALL (*)    Ketones, ur 5 (*)    Leukocytes,Ua TRACE (*)    All other components within normal limits  COMPREHENSIVE METABOLIC PANEL - Abnormal; Notable for the following components:   Calcium 8.6 (*)    All other components within normal limits  RESP PANEL BY RT-PCR (RSV, FLU A&B, COVID)  RVPGX2  GROUP A STREP BY PCR  CBC WITH DIFFERENTIAL/PLATELET  LIPASE, BLOOD  POC URINE PREG, ED     EKG    RADIOLOGY   No results found.   PROCEDURES:  Critical Care performed: No  Procedures   MEDICATIONS ORDERED IN ED: Medications  ondansetron (ZOFRAN-ODT) disintegrating tablet 4 mg (4 mg Oral Given 08/05/22 1907)     IMPRESSION / MDM / ASSESSMENT AND PLAN / ED COURSE  I reviewed the triage vital signs and the nursing notes.  Differential diagnosis includes, but is not limited to, COVID, flu, RSV, AOM, viral URI, viral gastroenteritis strep pharyngitis  Patient's presentation is most consistent with acute complicated illness / injury requiring diagnostic workup.  Patient to the ED for evaluation of intermittent symptoms of headache, nausea, vomiting, and constipation for the last 2 weeks.  Patient denies any fevers, chills, chest pain, shortness of breath.  She presents to the ED in no acute distress for evaluation.  She is nontoxic in appearance, without signs of dehydration.  No lab abnormalities noted including leukocytosis, critical anemia, or electrolyte abnormality.  No indication of viral respiratory infection or strep pharyngitis.  Patient is stable throughout her course in the ED, and no significant improvement after nausea medicine administration.  She appears stable at this time for discharge and outpatient management with her primary provider.  Patient's diagnosis is consistent with acute non-intractable headache, and nonspecific  nausea. Patient will be discharged home with prescriptions for Zofran. Patient is to follow up with her primary provider as needed or otherwise directed. Patient is given ED precautions to return to the ED for any worsening or new symptoms.  FINAL CLINICAL IMPRESSION(S) / ED DIAGNOSES   Final diagnoses:  Acute nonintractable headache, unspecified headache type  Nausea and vomiting, unspecified vomiting type     Rx / DC Orders   ED Discharge Orders          Ordered    ondansetron (ZOFRAN-ODT) 4 MG disintegrating tablet  Every 8 hours PRN        08/05/22 2111             Note:  This document was prepared using Dragon voice recognition software and may include unintentional dictation errors.    Melvenia Needles, PA-C 08/05/22 2328    Merlyn Lot, MD 08/08/22 [phone]

## 2023-01-16 ENCOUNTER — Other Ambulatory Visit: Payer: Medicaid Other

## 2023-01-16 ENCOUNTER — Other Ambulatory Visit: Payer: Self-pay

## 2023-01-16 DIAGNOSIS — Z5321 Procedure and treatment not carried out due to patient leaving prior to being seen by health care provider: Secondary | ICD-10-CM | POA: Insufficient documentation

## 2023-01-16 DIAGNOSIS — T7840XA Allergy, unspecified, initial encounter: Secondary | ICD-10-CM | POA: Insufficient documentation

## 2023-01-16 NOTE — ED Triage Notes (Signed)
Pt to ed from home via POV for allergic reaction. [place] and her mom used one detergent, when she uses another one. Pt was rash all over her legs and arms. No airway involvement. Pt is caox4, in no acute distress and ambulatory in triage.

## 2023-01-17 ENCOUNTER — Emergency Department
Admission: EM | Admit: 2023-01-17 | Discharge: 2023-01-17 | Discharge status: Against Medical Advice | Payer: Medicaid Other | Attending: Emergency Medicine | Admitting: Emergency Medicine

## 2023-01-17 NOTE — ED Notes (Signed)
Called pts on number provided in chart and there was no answer

## 2023-01-17 NOTE — ED Notes (Signed)
Called name 3 times in lobby. No response. Not visible in lobby.

## 2023-05-12 ENCOUNTER — Encounter: Payer: Self-pay | Admitting: *Deleted

## 2023-05-12 ENCOUNTER — Other Ambulatory Visit: Payer: Self-pay

## 2023-05-12 ENCOUNTER — Emergency Department
Admission: EM | Admit: 2023-05-12 | Discharge: 2023-05-12 | Disposition: A | Discharge status: Home / Self Care | Payer: Medicaid Other | Attending: Emergency Medicine | Admitting: Emergency Medicine

## 2023-05-12 DIAGNOSIS — N390 Urinary tract infection, site not specified: Secondary | ICD-10-CM | POA: Insufficient documentation

## 2023-05-12 LAB — URINALYSIS, ROUTINE W REFLEX MICROSCOPIC
Bilirubin Urine: NEGATIVE
Glucose, UA: NEGATIVE mg/dL
Ketones, ur: NEGATIVE mg/dL
Nitrite: NEGATIVE
Protein, ur: 30 mg/dL — AB
Specific Gravity, Urine: 1.017 (ref 1.005–1.030)
WBC, UA: >50 WBC/hpf (ref 0–5)
pH: 7 (ref 5.0–8.0)

## 2023-05-12 LAB — POC URINE PREG, ED: Preg Test, Ur: NEGATIVE

## 2023-05-12 MED ORDER — PHENAZOPYRIDINE HCL 200 MG PO TABS: 200.0000 mg | ORAL_TABLET | Freq: Three times a day (TID) | ORAL | 0 refills | Status: AC | PRN

## 2023-05-12 MED ORDER — ONDANSETRON 4 MG PO TBDP
4.0000 mg | ORAL_TABLET | Freq: Once | ORAL | Status: AC
  Administered 2023-05-12: 4 mg via ORAL
  Filled 2023-05-12: qty 1

## 2023-05-12 MED ORDER — CEPHALEXIN 500 MG PO CAPS
500.0000 mg | ORAL_CAPSULE | Freq: Once | ORAL | Status: AC
  Administered 2023-05-12: 500 mg via ORAL
  Filled 2023-05-12: qty 1

## 2023-05-12 MED ORDER — HYDROCODONE-ACETAMINOPHEN 5-325 MG PO TABS: 1.0000 | ORAL_TABLET | ORAL | 0 refills | Status: AC | PRN

## 2023-05-12 MED ORDER — OXYCODONE-ACETAMINOPHEN 5-325 MG PO TABS
1.0000 | ORAL_TABLET | Freq: Once | ORAL | Status: AC
  Administered 2023-05-12: 1 via ORAL
  Filled 2023-05-12: qty 1

## 2023-05-12 MED ORDER — PHENAZOPYRIDINE HCL 200 MG PO TABS
200.0000 mg | ORAL_TABLET | Freq: Once | ORAL | Status: AC
  Administered 2023-05-12: 200 mg via ORAL
  Filled 2023-05-12: qty 1

## 2023-05-12 MED ORDER — CEPHALEXIN 500 MG PO CAPS: 500.0000 mg | ORAL_CAPSULE | Freq: Three times a day (TID) | ORAL | 0 refills | Status: AC

## 2023-05-12 NOTE — ED Notes (Signed)
Provided pt with discharge instructions and education. All of pt questions answered. Pt in possession of all belongings. Pt AAOX4 and stable at time of discharge.Pt ambulated w/ steady gait towards ED exit. Pt accompanied by family member.

## 2023-05-12 NOTE — ED Provider Notes (Signed)
[facility] Provider Note    Event Date/Time   First MD Initiated Contact with Patient 05/12/23 2052     (approximate)  History   Chief Complaint: Back Pain  HPI  Alexis Horne is a 32 y.o. female with no significant past medical history who presents to the emergency department for dysuria and back pain.  According to the patient for the past week or so she has felt like she has had a urinary tract infection, describes a discomfort when she urinates described as a pulling sensation.  Patient states she is now experiencing some back pain.  No fever no vomiting no diarrhea.  Patient states she took Azo over-the-counter to try to get rid of the infection without success so she came to the emergency department.  Physical Exam   Triage Vital Signs: ED Triage Vitals  Encounter Vitals Group     BP 05/12/23 1942 (!) 149/106     Systolic BP Percentile --      Diastolic BP Percentile --      Pulse Rate 05/12/23 1942 99     Resp 05/12/23 1942 18     Temp 05/12/23 1942 98.5 F (36.9 C)     Temp Source 05/12/23 1942 Oral     SpO2 05/12/23 1942 96 %     Weight 05/12/23 1942 160 lb (72.6 kg)     Height 05/12/23 1942 5\' 5"  (1.651 m)     Head Circumference --      Peak Flow --      Pain Score 05/12/23 1941 6     Pain Loc --      Pain Education --      Exclude from Growth Chart --     Most recent vital signs: Vitals:   05/12/23 1942  BP: (!) 149/106  Pulse: 99  Resp: 18  Temp: 98.5 F (36.9 C)  SpO2: 96%    General: Awake, no distress.  CV:  Good peripheral perfusion.  Regular rate and rhythm  Resp:  Normal effort.  Equal breath sounds bilaterally.  Abd:  No distention.  Soft, mild suprapubic tenderness.  Mild bilateral CVA tenderness.  ED Results / Procedures / Treatments   MEDICATIONS ORDERED IN ED: Medications  cephALEXin (KEFLEX) capsule 500 mg (500 mg Oral Given 05/12/23 2112)  oxyCODONE-acetaminophen (PERCOCET/ROXICET) 5-325 MG per tablet  1 tablet (1 tablet Oral Given 05/12/23 2112)  ondansetron (ZOFRAN-ODT) disintegrating tablet 4 mg (4 mg Oral Given 05/12/23 2112)  phenazopyridine (PYRIDIUM) tablet 200 mg (200 mg Oral Given 05/12/23 2112)     IMPRESSION / MDM / ASSESSMENT AND PLAN / ED COURSE  I reviewed the triage vital signs and the nursing notes.  Patient's presentation is most consistent with acute illness / injury with system symptoms.  Patient presents emergency department for lower abdominal pain, dysuria and mild back pain.  Patient's urinalysis confirms urinary tract infection greater than 50 white cells with rare bacteria and white blood cell clumps likely indicating a urinary tract infection.  Given the patient's otherwise reassuring physical exam and reassuring vital signs I believe the patient will be safe to be discharged home on Keflex 3 times daily for 10 days as well as a short course of some pain medication and Pyridium.  Discussed return precautions for any worsening pain or development of fever.  Patient agreeable to plan of care.  FINAL CLINICAL IMPRESSION(S) / ED DIAGNOSES   Urinary tract infection   Note:  This document was prepared  using Conservation officer, historic buildings and may include unintentional dictation errors.   Minna Antis, MD 05/12/23 2138

## 2023-05-12 NOTE — ED Triage Notes (Signed)
Pt reports back pain since last week.  Pt does lifting at work   pt also has pain when urinating.  Pt alert.

## 2023-07-03 ENCOUNTER — Ambulatory Visit
Admission: RE | Admit: 2023-07-03 | Discharge: 2023-07-03 | Disposition: A | Discharge status: Home / Self Care | Payer: Medicaid Other | Source: Ambulatory Visit | Attending: Emergency Medicine | Admitting: Emergency Medicine

## 2023-07-03 VITALS — BP 137/89 | HR 92 | Temp 98.0°F | Resp 18

## 2023-07-03 DIAGNOSIS — J069 Acute upper respiratory infection, unspecified: Secondary | ICD-10-CM

## 2023-07-03 LAB — POC COVID19/FLU A&B COMBO
Covid Antigen, POC: NEGATIVE
Influenza A Antigen, POC: NEGATIVE
Influenza B Antigen, POC: NEGATIVE

## 2023-07-03 LAB — POCT RAPID STREP A (OFFICE): Rapid Strep A Screen: NEGATIVE

## 2023-07-03 MED ORDER — BENZONATATE 100 MG PO CAPS: 100.0000 mg | ORAL_CAPSULE | Freq: Three times a day (TID) | ORAL | 0 refills | Status: AC | PRN

## 2023-07-03 NOTE — ED Triage Notes (Signed)
Patient to Urgent Care with complaints of productive cough (color/ thick)/ nasal and chest congestion. Soreness in her chest. Denies any known fevers.   Reports symptoms started three days ago.  No otc meds.

## 2023-07-03 NOTE — Discharge Instructions (Signed)
The strep, COVID and flu tests are negative.   Take the [facility] as directed for cough.  Take Tylenol or ibuprofen as needed for fever or discomfort.  Take plain Mucinex as needed for congestion.  Rest and keep yourself hydrated.    Follow-up with your primary care provider if your symptoms are not improving.

## 2023-07-03 NOTE — ED Provider Notes (Signed)
Alexis Horne    CSN: [id number] Arrival date & time: 07/03/23  1556      History   Chief Complaint Chief Complaint  Patient presents with   Cough    congestion in the chest - Entered by patient    HPI Alexis Horne is a 32 y.o. female.  Patient presents with 3-day history of congestion, sore throat, cough.  No OTC medications taken.  No fever or shortness of breath.  Her medical history includes left hearing impairment and obesity.  The history is provided by the patient and medical records.    Past Medical History:  Diagnosis Date   Deaf, left    Obesity     Patient Active Problem List   Diagnosis Date Noted   Acute respiratory failure with hypoxia (HCC)    Hypokalemia    COVID-19 02/28/2020   BMI 40.0-44.9, adult (HCC) 08/19/2017    Past Surgical History:  Procedure Laterality Date   NO PAST SURGERIES      OB History     Gravida  1   Para  1   Term  1   Preterm      AB      Living  1      SAB      IAB      Ectopic      Multiple  0   Live Births  1            Home Medications    Prior to Admission medications   Medication Sig Start Date End Date Taking? Authorizing Provider  benzonatate (TESSALON) 100 MG capsule Take 1 capsule (100 mg total) by mouth 3 (three) times daily as needed for cough. 07/03/23  Yes Mickie Bail, NP  cephALEXin (KEFLEX) 500 MG capsule Take 1 capsule (500 mg total) by mouth 3 (three) times daily. Patient not taking: Reported on 07/03/2023 05/12/23   Minna Antis, MD  HYDROcodone-acetaminophen (NORCO/VICODIN) 5-325 MG tablet Take 1 tablet by mouth every 4 (four) hours as needed. Patient not taking: Reported on 07/03/2023 05/12/23   Minna Antis, MD  phenazopyridine (PYRIDIUM) 200 MG tablet Take 1 tablet (200 mg total) by mouth 3 (three) times daily as needed for pain. Patient not taking: Reported on 07/03/2023 05/12/23 05/11/24  Minna Antis, MD    Family History Family  History  Problem Relation Age of Onset   Hypertension Mother    Diabetes Mother    Cancer Father 30       colon   Congestive Heart Failure Father    Hypertension Father    Diabetes Father     Social History Social History   Tobacco Use   Smoking status: Never   Smokeless tobacco: Never  Vaping Use   Vaping status: Never Used  Substance Use Topics   Alcohol use: No   Drug use: No     Allergies   Ibuprofen, Amoxicillin, and Kiwi extract   Review of Systems Review of Systems  Constitutional:  Negative for chills and fever.  HENT:  Positive for congestion and sore throat. Negative for ear pain.   Respiratory:  Positive for cough. Negative for shortness of breath.      Physical Exam Triage Vital Signs ED Triage Vitals  Encounter Vitals Group     BP      Systolic BP Percentile      Diastolic BP Percentile      Pulse      Resp  Temp      Temp src      SpO2      Weight      Height      Head Circumference      Peak Flow      Pain Score      Pain Loc      Pain Education      Exclude from Growth Chart    No data found.  Updated Vital Signs BP 137/89   Pulse 92   Temp 98 F (36.7 C)   Resp 18   LMP 06/19/2023   SpO2 98%   Visual Acuity Right Eye Distance:   Left Eye Distance:   Bilateral Distance:    Right Eye Near:   Left Eye Near:    Bilateral Near:     Physical Exam Constitutional:      General: She is not in acute distress. HENT:     Right Ear: Tympanic membrane normal.     Left Ear: Tympanic membrane normal.     Nose: Rhinorrhea present.     Mouth/Throat:     Mouth: Mucous membranes are moist.     Pharynx: Posterior oropharyngeal erythema present.  Cardiovascular:     Rate and Rhythm: Normal rate and regular rhythm.     Heart sounds: Normal heart sounds.  Pulmonary:     Effort: Pulmonary effort is normal. No respiratory distress.     Breath sounds: Normal breath sounds.  Skin:    General: Skin is warm and dry.  Neurological:      Mental Status: She is alert.      UC Treatments / Results  Labs (all labs ordered are listed, but only abnormal results are displayed) Labs Reviewed  POCT RAPID STREP A (OFFICE)  POC COVID19/FLU A&B COMBO    EKG   Radiology No results found.  Procedures Procedures (including critical care time)  Medications Ordered in UC Medications - No data to display  Initial Impression / Assessment and Plan / UC Course  I have reviewed the triage vital signs and the nursing notes.  Pertinent labs & imaging results that were available during my care of the patient were reviewed by me and considered in my medical decision making (see chart for details).   Viral URI.  Afebrile and vital signs are stable.  Lungs are clear and O2 sat is 98% on room air.  Rapid strep negative.  Rapid COVID and flu negative.  Treating cough with Tessalon Perles.  Discussed symptomatic treatment including Tylenol or ibuprofen as needed for fever or discomfort, plain Mucinex as needed for congestion, rest, hydration.  Instructed patient to follow-up with her PCP if not improving.  ED precautions given.  Patient agrees to plan of care.   Final Clinical Impressions(s) / UC Diagnoses   Final diagnoses:  Viral URI     Discharge Instructions      The strep, COVID and flu tests are negative.   Take the [facility] as directed for cough.  Take Tylenol or ibuprofen as needed for fever or discomfort.  Take plain Mucinex as needed for congestion.  Rest and keep yourself hydrated.    Follow-up with your primary care provider if your symptoms are not improving.         ED Prescriptions     Medication Sig Dispense Auth. Provider   benzonatate (TESSALON) 100 MG capsule Take 1 capsule (100 mg total) by mouth 3 (three) times daily as needed for cough. 21 capsule  Mickie Bail, NP      PDMP not reviewed this encounter.   Mickie Bail, NP 07/03/23 [phone]

## 2023-09-01 ENCOUNTER — Other Ambulatory Visit: Payer: Self-pay

## 2023-09-01 ENCOUNTER — Emergency Department
Admission: EM | Admit: 2023-09-01 | Discharge: 2023-09-01 | Disposition: A | Discharge status: Home / Self Care | Payer: Self-pay | Attending: Emergency Medicine | Admitting: Emergency Medicine

## 2023-09-01 DIAGNOSIS — R102 Pelvic and perineal pain: Secondary | ICD-10-CM | POA: Insufficient documentation

## 2023-09-01 LAB — URINALYSIS, ROUTINE W REFLEX MICROSCOPIC
Bilirubin Urine: NEGATIVE
Glucose, UA: NEGATIVE mg/dL
Hgb urine dipstick: NEGATIVE
Ketones, ur: NEGATIVE mg/dL
Nitrite: NEGATIVE
Protein, ur: 30 mg/dL — AB
Specific Gravity, Urine: 1.021 (ref 1.005–1.030)
pH: 7 (ref 5.0–8.0)

## 2023-09-01 LAB — POC URINE PREG, ED: Preg Test, Ur: NEGATIVE

## 2023-09-01 NOTE — ED Provider Notes (Signed)
[facility] Provider Note    Event Date/Time   First MD Initiated Contact with Patient 09/01/23 0818     (approximate)   History   Possible Pregnancy   HPI  Alexis Horne is a 33 y.o. female with no significant past medical history and as listed in EMR presents to the emergency department for treatment and evaluation of intermittent pelvic cramping that started last week.  She would like to have a pregnancy test.  She states that she missed her.  In February.  Last period was early January.  She denies concern for STI exposure.  No dysuria.      Physical Exam   Triage Vital Signs: ED Triage Vitals  Encounter Vitals Group     BP 09/01/23 0807 (!) 158/90     Systolic BP Percentile --      Diastolic BP Percentile --      Pulse Rate 09/01/23 0807 89     Resp 09/01/23 0807 20     Temp 09/01/23 0807 97.7 F (36.5 C)     Temp Source 09/01/23 0807 Oral     SpO2 09/01/23 0807 97 %     Weight 09/01/23 0809 270 lb (122.5 kg)     Height 09/01/23 0809 5\' 5"  (1.651 m)     Head Circumference --      Peak Flow --      Pain Score 09/01/23 0807 5     Pain Loc --      Pain Education --      Exclude from Growth Chart --     Most recent vital signs: Vitals:   09/01/23 0807  BP: (!) 158/90  Pulse: 89  Resp: 20  Temp: 97.7 F (36.5 C)  SpO2: 97%    General: Awake, no distress.  CV:  Good peripheral perfusion.  Resp:  Normal effort.  Abd:  No distention.  Other:     ED Results / Procedures / Treatments   Labs (all labs ordered are listed, but only abnormal results are displayed) Labs Reviewed  URINALYSIS, ROUTINE W REFLEX MICROSCOPIC - Abnormal; Notable for the following components:      Result Value   Color, Urine YELLOW (*)    APPearance CLOUDY (*)    Protein, ur 30 (*)    Leukocytes,Ua LARGE (*)    Bacteria, UA MANY (*)    All other components within normal limits  POC URINE PREG, ED     EKG  Not  indicated.   RADIOLOGY  Image and radiology report reviewed and interpreted by me. Radiology report consistent with the same.  Not indicated  PROCEDURES:  Critical Care performed: No  Procedures   MEDICATIONS ORDERED IN ED:  Medications - No data to display   IMPRESSION / MDM / ASSESSMENT AND PLAN / ED COURSE   I have reviewed the triage note.  Differential diagnosis includes, but is not limited to, early pregnancy, pelvic pain in pregnancy, menstrual cramps  Patient's presentation is most consistent with acute, uncomplicated illness.  33 year old female presenting to the emergency department for pregnancy test.  Pregnancy test here today is negative.  Urinalysis does show large amount leukocytes, white blood cells, and many bacteria however 21-50 squamous epithelial cells.  Patient is asymptomatic of acute cystitis/pyelonephritis.  She again denies any concern for STI exposure.  Pelvic cramping is likely related to oncoming menses.  Patient advised to see her GYN or primary care provider if pelvic cramping continues and she  does not have her menstrual cycle.  If symptoms change or worsen, she is to return to the emergency department.      FINAL CLINICAL IMPRESSION(S) / ED DIAGNOSES   Final diagnoses:  Pelvic cramping     Rx / DC Orders   ED Discharge Orders     None        Note:  This document was prepared using Dragon voice recognition software and may include unintentional dictation errors.   Chinita Pester, FNP 09/01/23 1029    Sharyn Creamer, MD 09/01/23 1216

## 2023-09-01 NOTE — ED Triage Notes (Signed)
Pt to ED with her 2 children for intermittent pelvic and lower abdominal cramping since last week. States LNMP was early January and missed period in Feb. Did home pregnancy test 1 week ago and it was negative. Denies pain/burning with urination.

## 2023-09-01 NOTE — ED Notes (Signed)
Pt reports missing her period at the beginning of the feb, states that she always gets her cycle at the beginning of the month and has been having lower abd cramping, reports sometimes that her periods are irreg, last bm was this am and normal, denies any n/v/d

## 2023-09-02 ENCOUNTER — Encounter: Payer: Self-pay | Admitting: Obstetrics and Gynecology

## 2023-09-02 ENCOUNTER — Other Ambulatory Visit (HOSPITAL_COMMUNITY)
Admission: RE | Admit: 2023-09-02 | Discharge: 2023-09-02 | Disposition: A | Discharge status: Home / Self Care | Payer: Medicaid Other | Source: Ambulatory Visit | Attending: Obstetrics and Gynecology | Admitting: Obstetrics and Gynecology

## 2023-09-02 ENCOUNTER — Ambulatory Visit (INDEPENDENT_AMBULATORY_CARE_PROVIDER_SITE_OTHER): Payer: Self-pay | Admitting: Obstetrics and Gynecology

## 2023-09-02 VITALS — BP 128/90 | HR 97 | Resp 16 | Ht 65.0 in | Wt 258.1 lb

## 2023-09-02 DIAGNOSIS — Z87440 Personal history of urinary (tract) infections: Secondary | ICD-10-CM

## 2023-09-02 DIAGNOSIS — N3090 Cystitis, unspecified without hematuria: Secondary | ICD-10-CM | POA: Insufficient documentation

## 2023-09-02 DIAGNOSIS — N898 Other specified noninflammatory disorders of vagina: Secondary | ICD-10-CM | POA: Diagnosis present

## 2023-09-02 DIAGNOSIS — Z124 Encounter for screening for malignant neoplasm of cervix: Secondary | ICD-10-CM

## 2023-09-02 DIAGNOSIS — R102 Pelvic and perineal pain: Secondary | ICD-10-CM

## 2023-09-02 LAB — POCT URINALYSIS DIPSTICK
Bilirubin, UA: NEGATIVE
Glucose, UA: NEGATIVE
Ketones, UA: NEGATIVE
Nitrite, UA: NEGATIVE
Protein, UA: POSITIVE — AB
Spec Grav, UA: 1.025 (ref 1.010–1.025)
Urobilinogen, UA: 0.2 U/dL
pH, UA: 6 (ref 5.0–8.0)

## 2023-09-02 NOTE — Progress Notes (Signed)
GYNECOLOGY PROGRESS NOTE  Subjective:    Patient ID: Alexis Horne, female    DOB: 07-Nov-1990, 33 y.o.   MRN: [id number]  HPI  Patient is a 33 y.o. G41P1001 female who presents for ED follow up. She was seen yesterday in the ED due to complaints of pelvic cramping that have been ongoing for over a week.  Patient notes that she and her partner have been trying to conceive and so was concerned with the cramping as she has not yet had a menstrual cycle for this month. Patient's last menstrual period was 08/04/2023 (approximate, however noted to the ED that her LMP may have been closer to the first of the month yesterday).  Evaluation returned with a negative pregnancy test and UA with large leukocytes, white blood cells, and many bacteria.  Patient states never received a diagnosis for pelvic cramping.  Was instead told to follow-up with a GYN.  Of note, patient does note a history of UTI last year, also has a history of kidney stones.     Menstrual History: OB History  Gravida Para Term Preterm AB Living  1 1 1   1   SAB IAB Ectopic Multiple Live Births     0 1    # Outcome Date GA Lbr Len/2nd Weight Sex Type Anes PTL Lv  1 Term 03/12/18 [redacted]w[redacted]d 02:47 / 01:20 7 lb 5.8 oz (3.34 kg) M Vag-Spont EPI  LIV     Birth Comments: epidural ran out    Menarche age: 33 Patient's last menstrual period was 07/22/2023 (approximate). Last pap smear: thinks it was with her last pregnancy several years ago.     Past Medical History:  Diagnosis Date   Deaf, left    Kidney stones 10/2021   Obesity     Family History  Problem Relation Age of Onset   Hypertension Mother    Diabetes Mother    Cancer Father 31       colon   Congestive Heart Failure Father    Hypertension Father    Diabetes Father     Past Surgical History:  Procedure Laterality Date   NO PAST SURGERIES      Social History   Socioeconomic History   Marital status: Single    Spouse name: Not on file   Number of  children: 1   Years of education: 28   Highest education level: Not on file  Occupational History   Occupation: STAY AT HOME MOM  Tobacco Use   Smoking status: Never   Smokeless tobacco: Never  Vaping Use   Vaping status: Never Used  Substance and Sexual Activity   Alcohol use: No   Drug use: No   Sexual activity: Not Currently    Comment: Not in the last 24 hours  Other Topics Concern   Not on file  Social History Narrative   Not on file   Social Drivers of Health   Financial Resource Strain: Not on file  Food Insecurity: Not on file  Transportation Needs: Not on file  Physical Activity: Not on file  Stress: Not on file  Social Connections: Not on file  Intimate Partner Violence: Not on file     Current Outpatient Medications on File Prior to Visit  Medication Sig Dispense Refill   benzonatate (TESSALON) 100 MG capsule Take 1 capsule (100 mg total) by mouth 3 (three) times daily as needed for cough. 21 capsule 0   cephALEXin (KEFLEX) 500 MG capsule Take 1  capsule (500 mg total) by mouth 3 (three) times daily. (Patient not taking: Reported on 07/03/2023) 30 capsule 0   HYDROcodone-acetaminophen (NORCO/VICODIN) 5-325 MG tablet Take 1 tablet by mouth every 4 (four) hours as needed. (Patient not taking: Reported on 07/03/2023) 8 tablet 0   phenazopyridine (PYRIDIUM) 200 MG tablet Take 1 tablet (200 mg total) by mouth 3 (three) times daily as needed for pain. (Patient not taking: Reported on 07/03/2023) 20 tablet 0   No current facility-administered medications on file prior to visit.    Allergies  Allergen Reactions   Ibuprofen Swelling   Amoxicillin Itching   Kiwi Extract     Swollen and itchy      Review of Systems Pertinent items noted in HPI and remainder of comprehensive ROS otherwise negative.   Objective:   Blood pressure (!) 128/90, pulse 97, resp. rate 16, height 5\' 5"  (1.651 m), weight 258 lb 1.6 oz (117.1 kg), last menstrual period 07/22/2023.  Body  mass index is 42.95 kg/m. General appearance: alert and no distress Abdomen: soft, non-tender; bowel sounds normal; no masses,  no organomegaly Pelvic: external genitalia normal, rectovaginal septum normal.  Vagina with small amount of yellow-white discharge.  Cervix normal appearing, no lesions and no motion tenderness.  Uterus mobile, nontender, normal shape and size.  Adnexae non-palpable, nontender bilaterally.  Extremities: extremities normal, atraumatic, no cyanosis or edema Neurologic: Grossly normal   Labs:  Results for orders placed or performed in visit on 09/02/23  POCT Urinalysis Dipstick   Collection Time: 09/02/23  3:47 PM  Result Value Ref Range   Color, UA     Clarity, UA     Glucose, UA Negative Negative   Bilirubin, UA NEGAT    Ketones, UA NEGAT    Spec Grav, UA 1.025 1.010 - 1.025   Blood, UA TRACE    pH, UA 6.0 5.0 - 8.0   Protein, UA Positive (A) Negative   Urobilinogen, UA 0.2 0.2 or 1.0 E.U./dL   Nitrite, UA NEG    Leukocytes, UA Trace (A) Negative   Appearance     Odor      Admission on 09/01/2023, Discharged on 09/01/2023  Component Date Value Ref Range Status   Color, Urine 09/01/2023 YELLOW (A)  YELLOW Final   APPearance 09/01/2023 CLOUDY (A)  CLEAR Final   Specific Gravity, Urine 09/01/2023 1.021  1.005 - 1.030 Final   pH 09/01/2023 7.0  5.0 - 8.0 Final   Glucose, UA 09/01/2023 NEGATIVE  NEGATIVE mg/dL Final   Hgb urine dipstick 09/01/2023 NEGATIVE  NEGATIVE Final   Bilirubin Urine 09/01/2023 NEGATIVE  NEGATIVE Final   Ketones, ur 09/01/2023 NEGATIVE  NEGATIVE mg/dL Final   Protein, ur 16/04/9603 30 (A)  NEGATIVE mg/dL Final   Nitrite 54/03/8118 NEGATIVE  NEGATIVE Final   Leukocytes,Ua 09/01/2023 LARGE (A)  NEGATIVE Final   RBC / HPF 09/01/2023 0-5  0 - 5 RBC/hpf Final   WBC, UA 09/01/2023 21-50  0 - 5 WBC/hpf Final   Bacteria, UA 09/01/2023 MANY (A)  NONE SEEN Final   Squamous Epithelial / HPF 09/01/2023 21-50  0 - 5 /HPF Final   Mucus  09/01/2023 PRESENT   Final   Performed at [facility], [address] [place], [place] [address]   Preg Test, Ur 09/01/2023 Negative  Negative Final     Assessment:   1. Pelvic cramping   2. History of UTI   3. Cervical cancer screening     Plan:   Unclear  cause of pelvic cramping. Patient may be due to have her cycle in the next few days if LMP is closer to middle of the month. Otherwise, is overdue for a cycle with negative recent pregnancy test. Patient feels more certain her LMP was towards the middle. Offered BHCG levels to confirm or rule out an early pregnancy definitively, as patient is attempting to conceive. Can utilize Tylenol for cramping until results return. If no cycle by next week, can prescribe Provera challenge.  Patient with history of UTI with initial urine showing bacteria and leukocytes. However negative for nitrates. Will  perform culture. Also encouraged on increasing hydration and consumption of cranberry juice. Cervical cancer screening performed today as patient is overdue for screening.   Hildred Laser, MD [facility]

## 2023-09-03 ENCOUNTER — Encounter: Payer: Self-pay | Admitting: Obstetrics and Gynecology

## 2023-09-03 ENCOUNTER — Other Ambulatory Visit: Payer: Self-pay

## 2023-09-03 DIAGNOSIS — R102 Pelvic and perineal pain: Secondary | ICD-10-CM

## 2023-09-03 DIAGNOSIS — N898 Other specified noninflammatory disorders of vagina: Secondary | ICD-10-CM

## 2023-09-03 LAB — BETA HCG QUANT (REF LAB): hCG Quant: <1 m[IU]/mL

## 2023-09-03 NOTE — Progress Notes (Signed)
Cytology called and states they have a thin prep and a aptima swab but no order for the swab, Order placed.

## 2023-09-04 LAB — URINE CULTURE

## 2023-09-04 LAB — CERVICOVAGINAL ANCILLARY ONLY
Bacterial Vaginitis (gardnerella): NEGATIVE
Candida Glabrata: NEGATIVE
Candida Vaginitis: NEGATIVE
Chlamydia: NEGATIVE
Comment: NEGATIVE
Comment: NEGATIVE
Comment: NEGATIVE
Comment: NEGATIVE
Comment: NEGATIVE
Comment: NORMAL
Neisseria Gonorrhea: NEGATIVE
Trichomonas: NEGATIVE

## 2023-09-07 LAB — CYTOLOGY - PAP
Comment: NEGATIVE
Diagnosis: NEGATIVE
High risk HPV: NEGATIVE

## 2023-09-30 ENCOUNTER — Ambulatory Visit: Payer: Medicaid Other | Admitting: Obstetrics

## 2023-10-06 ENCOUNTER — Encounter: Payer: Self-pay | Admitting: Obstetrics
